# Patient Record
Sex: Female | Born: 1980 | Race: Black or African American | Hispanic: No | Marital: Married | Smoking: Never smoker
Health system: Southern US, Community
[De-identification: ages and names within clinical notes are randomized; demographics above are authoritative.]

## PROBLEM LIST (undated history)

## (undated) DIAGNOSIS — G43009 Migraine without aura, not intractable, without status migrainosus: Secondary | ICD-10-CM

## (undated) DIAGNOSIS — D649 Anemia, unspecified: Secondary | ICD-10-CM

## (undated) DIAGNOSIS — G5600 Carpal tunnel syndrome, unspecified upper limb: Secondary | ICD-10-CM

## (undated) HISTORY — DX: Anemia, unspecified: D64.9

## (undated) HISTORY — PX: TUBAL LIGATION: SHX77

## (undated) HISTORY — DX: Carpal tunnel syndrome, unspecified upper limb: G56.00

## (undated) HISTORY — DX: Migraine without aura, not intractable, without status migrainosus: G43.009

---

## 2003-11-03 ENCOUNTER — Other Ambulatory Visit: Admission: RE | Admit: 2003-11-03 | Discharge: 2003-11-03 | Payer: Self-pay | Admitting: *Deleted

## 2003-12-08 ENCOUNTER — Other Ambulatory Visit: Admission: RE | Admit: 2003-12-08 | Discharge: 2003-12-08 | Payer: Self-pay | Admitting: *Deleted

## 2004-10-07 ENCOUNTER — Other Ambulatory Visit: Admission: RE | Admit: 2004-10-07 | Discharge: 2004-10-07 | Payer: Self-pay | Admitting: *Deleted

## 2005-04-12 ENCOUNTER — Other Ambulatory Visit: Admission: RE | Admit: 2005-04-12 | Discharge: 2005-04-12 | Payer: Self-pay | Admitting: *Deleted

## 2005-12-27 ENCOUNTER — Encounter: Admission: RE | Admit: 2005-12-27 | Discharge: 2005-12-27 | Payer: Self-pay | Admitting: Obstetrics and Gynecology

## 2006-09-28 ENCOUNTER — Emergency Department (HOSPITAL_COMMUNITY): Admission: EM | Admit: 2006-09-28 | Discharge: 2006-09-28 | Payer: Self-pay | Admitting: Emergency Medicine

## 2007-04-09 ENCOUNTER — Emergency Department: Payer: Self-pay | Admitting: Emergency Medicine

## 2007-05-07 ENCOUNTER — Emergency Department (HOSPITAL_COMMUNITY): Admission: EM | Admit: 2007-05-07 | Discharge: 2007-05-07 | Payer: Self-pay | Admitting: Emergency Medicine

## 2007-07-27 ENCOUNTER — Ambulatory Visit (HOSPITAL_COMMUNITY): Admission: RE | Admit: 2007-07-27 | Discharge: 2007-07-27 | Payer: Self-pay | Admitting: Obstetrics and Gynecology

## 2007-08-26 ENCOUNTER — Inpatient Hospital Stay (HOSPITAL_COMMUNITY): Admission: AD | Admit: 2007-08-26 | Discharge: 2007-08-26 | Payer: Self-pay | Admitting: Gynecology

## 2007-08-26 ENCOUNTER — Ambulatory Visit: Payer: Self-pay | Admitting: Gynecology

## 2007-12-19 ENCOUNTER — Inpatient Hospital Stay (HOSPITAL_COMMUNITY): Admission: AD | Admit: 2007-12-19 | Discharge: 2007-12-23 | Payer: Self-pay | Admitting: Obstetrics and Gynecology

## 2007-12-20 ENCOUNTER — Encounter: Payer: Self-pay | Admitting: Obstetrics and Gynecology

## 2008-07-30 ENCOUNTER — Emergency Department (HOSPITAL_BASED_OUTPATIENT_CLINIC_OR_DEPARTMENT_OTHER): Admission: EM | Admit: 2008-07-30 | Discharge: 2008-07-30 | Payer: Self-pay | Admitting: Emergency Medicine

## 2008-07-30 ENCOUNTER — Ambulatory Visit: Payer: Self-pay | Admitting: Interventional Radiology

## 2009-09-30 ENCOUNTER — Emergency Department (HOSPITAL_COMMUNITY): Admission: EM | Admit: 2009-09-30 | Discharge: 2009-09-30 | Payer: Self-pay | Admitting: Emergency Medicine

## 2010-04-16 LAB — CBC
MCH: 30.8 pg (ref 26.0–34.0)
MCHC: 34.3 g/dL (ref 30.0–36.0)
MCV: 89.9 fL (ref 78.0–100.0)
Platelets: 170 10*3/uL (ref 150–400)
RBC: 4.22 MIL/uL (ref 3.87–5.11)
RDW: 12 % (ref 11.5–15.5)

## 2010-04-16 LAB — COMPREHENSIVE METABOLIC PANEL
ALT: 10 U/L (ref 0–35)
Alkaline Phosphatase: 44 U/L (ref 39–117)
BUN: 13 mg/dL (ref 6–23)
Chloride: 109 mEq/L (ref 96–112)
Creatinine, Ser: 0.8 mg/dL (ref 0.4–1.2)
GFR calc Af Amer: >60 mL/min (ref >60)
GFR calc non Af Amer: >60 mL/min (ref >60)
Sodium: 139 mEq/L (ref 135–145)

## 2010-04-16 LAB — DIFFERENTIAL
Basophils Relative: 1 % (ref 0–1)
Eosinophils Relative: 3 % (ref 0–5)
Lymphocytes Relative: 31 % (ref 12–46)
Lymphs Abs: 2.5 10*3/uL (ref 0.7–4.0)
Monocytes Absolute: 0.5 10*3/uL (ref 0.1–1.0)
Monocytes Relative: 7 % (ref 3–12)

## 2010-04-16 LAB — SALICYLATE LEVEL: Salicylate Lvl: <4 mg/dL (ref 2.8–20.0)

## 2010-04-16 LAB — URINALYSIS, ROUTINE W REFLEX MICROSCOPIC
Glucose, UA: NEGATIVE mg/dL
Leukocytes, UA: NEGATIVE
Urobilinogen, UA: 0.2 mg/dL (ref 0.0–1.0)

## 2010-04-16 LAB — URINE MICROSCOPIC-ADD ON

## 2010-04-16 LAB — RAPID URINE DRUG SCREEN, HOSP PERFORMED
Opiates: NOT DETECTED
Tetrahydrocannabinol: NOT DETECTED

## 2010-05-10 LAB — DIFFERENTIAL
Basophils Relative: 1 % (ref 0–1)
Eosinophils Absolute: 0.1 10*3/uL (ref 0.0–0.7)
Eosinophils Relative: 1 % (ref 0–5)
Lymphs Abs: 3.2 10*3/uL (ref 0.7–4.0)
Monocytes Relative: 7 % (ref 3–12)

## 2010-05-10 LAB — CBC
HCT: 38.4 % (ref 36.0–46.0)
MCV: 88.4 fL (ref 78.0–100.0)
RBC: 4.34 MIL/uL (ref 3.87–5.11)
RDW: 12.8 % (ref 11.5–15.5)
WBC: 8.3 10*3/uL (ref 4.0–10.5)

## 2010-05-10 LAB — PREGNANCY, URINE: Preg Test, Ur: NEGATIVE

## 2010-05-10 LAB — URINALYSIS, ROUTINE W REFLEX MICROSCOPIC
Ketones, ur: NEGATIVE mg/dL
Nitrite: NEGATIVE
Protein, ur: NEGATIVE mg/dL
Urobilinogen, UA: 0.2 mg/dL (ref 0.0–1.0)
pH: 6 (ref 5.0–8.0)

## 2010-05-10 LAB — BASIC METABOLIC PANEL
CO2: 30 mEq/L (ref 19–32)
Chloride: 105 mEq/L (ref 96–112)
Creatinine, Ser: 0.8 mg/dL (ref 0.4–1.2)
GFR calc non Af Amer: >60 mL/min (ref >60)

## 2010-06-15 NOTE — H&P (Signed)
NAMECANDIACE, Claudia Potts                ACCOUNT NO.:  [id number]   MEDICAL RECORD NO.:  [id number]          PATIENT TYPE:  INP   LOCATION:  9144                          FACILITY:  [facility]   PHYSICIAN:  Pieter Partridge, MD   DATE OF BIRTH:  May 01, 1980   DATE OF ADMISSION:  12/19/2007  DATE OF DISCHARGE:  12/23/2007                              HISTORY & PHYSICAL   CHIEF COMPLAINT:  Contractions.   HISTORY OF PRESENT ILLNESS:  [name] is a 30 year old gravida 2, para 1-  0-0-1, at 39-1/7th weeks with a history of HSV presented with complaint  of irregular contractions and vaginal bleeding, leakage of fluid.  She  has had herpes since age 16 and has periodic outbreaks with prodromal  symptoms.  The patient reports that she had some pain about 1 week ago  and it resolved 2 days ago.  She did not see any obvious lesions.  The  ulcers do not present at the same location and they continued to be in  various locations on the vulva.  The patient was on Valtrex 500 mg  b.i.d. which she states she was taking regularly.  Two days ago her  partner shaved her with a razor to prepare for labor.   REVIEW OF SYSTEMS:  Otherwise negative.   ALLERGIES:  No known drug allergies.   PAST MEDICAL HISTORY:  Negative.   SOCIAL HISTORY:  The patient is a Careers information officer.  Married.  No alcohol or  drug use.   OB HISTORY:  She had a vaginal delivery in 1999 at 38 weeks in  [place], 6 pounds 2 ounce female.  No complications with that  pregnancy.   GYN HISTORY:  She has had herpes and Chlamydia.   FAMILY HISTORY:  Remarkable for cancer, hypertension in her father and  heart disease in her father.  The patient's son has bronchitis and her  mother has asthma.   PHYSICAL EXAMINATION:  VITAL SIGNS:  On admission were normal.  GENERAL:  The patient was in moderate distress with contractions.  ABDOMEN:  Gravid, contractions palpated.  Moderate cervix was 5, effaced  to 100% -1.  She was ruptured with minimal fluid  return.  A vulvar  inspection revealed a linear open sore on the right side of her labia  which was slightly tender to touch.  The patient reviewed the lesion and  suspected that it was one of her typical lesions but it was open  nonetheless.  Her hemoglobin was 12.1.   ASSESSMENT:  Active labor at term with vulvar lesions suspicious for  active herpes.  I discussed at length if the increased mortality rate of  neonatal herpes infection is to be as high as 50%, the patient agreed on  this.  She did not want to take any chances so we proceeded with a  primary low-transverse cesarean section and she had previously been  counseled on bilateral tubal ligation which she continued to desire and  consented to prior to surgery.   ADDENDUM:  Prenatal care was unremarkable.  GBS status was negative and  all her  labs were within normal limits.      Pieter Partridge, MD  Electronically Signed     EBV/MEDQ  D:  12/23/2007  T:  12/23/2007  Job:  [id number]

## 2010-06-15 NOTE — Op Note (Signed)
NAMEABREANNA, DRAWDY                ACCOUNT NO.:  [id number]   MEDICAL RECORD NO.:  [id number]          PATIENT TYPE:  INP   LOCATION:  9144                          [facility]   PHYSICIAN:  Pieter Partridge, MD   DATE OF BIRTH:  11/15/1980   DATE OF PROCEDURE:  12/20/2007  DATE OF DISCHARGE:  12/23/2007                               OPERATIVE REPORT   ADDENDUM  Prior to closure, the bowel was run to make sure there was no injury.  The initial site was inspected and again the mucosa was intact.  The  intestines were returned to the abdomen without complication.      Pieter Partridge, MD  Electronically Signed     EBV/MEDQ  D:  12/23/2007  T:  12/24/2007  Job:  [id number] [id number]

## 2010-06-15 NOTE — Op Note (Signed)
Claudia Potts, Claudia Potts                ACCOUNT NO.:  [id number]   MEDICAL RECORD NO.:  [id number]          PATIENT TYPE:  INP   LOCATION:  9144                          [facility]   PHYSICIAN:  Pieter Partridge, MD   DATE OF BIRTH:  [birth year]   DATE OF PROCEDURE:  12/20/2007  DATE OF DISCHARGE:  12/23/2007                               OPERATIVE REPORT   PREOPERATIVE DIAGNOSES:  Pregnancy at 39-1/7th weeks, history of herpes  with questionable active lesion, undesired fertility.   POSTOPERATIVE DIAGNOSES:  Pregnancy at 39-1/7th weeks, history of herpes  with questionable active lesion, undesired fertility, right ovarian  endometrioma.   PROCEDURE:  Primary low transverse cesarean section, bilateral tubal  ligation via Pomeroy method, biopsy of right ovarian cyst.   SURGEON:  Pieter Partridge, MD   ASSISTANT:  Sarita Haver   ANESTHESIA:  Spinal.   ESTIMATED BLOOD LOSS:  800 mL.   INTRAVENOUS FLUIDS:  3200 mL.   URINE OUTPUT:  150 mL clear.   FINDINGS:  Vertex female infant 6 pounds 2 ounces, Apgars 9 at one and 9  at five, OP 1- to 2-cm right ovarian endometrioma, normal uterus,  tubes, and left ovary.   COMPLICATIONS:  Extension of transverse uterine incision to broad  ligament/paracervical area.   DISPOSITION:  Stable to PACU.   SPECIMENS:  Right and left fallopian tube segments, right ovarian cyst  biopsy.   PROCEDURE IN DETAIL:  Ms. [name] was taken to the operating room where she  underwent spinal anesthesia without complication.  She was placed in the  dorsal supine position and her cervix was evaluated.  She was 8 cm  completely effaced and +1 station.  She was then placed in the  dorsal  supine position with a leftward tilt and prepped and draped in the  normal sterile fashion.   After adequate anesthesia was confirmed, the abdomen was marked.  A  Pfannenstiel skin incision was made with the scalpel and carried down to  the underlying layer above the  fascia.  The subcutaneous vessels were  cauterized with hemostat and Bovie.   The fascia was then incised at the midline and extended laterally.  The  Kocher clamps were used to dissect the fascia off the rectus muscles  superiorly and inferiorly.  The muscles were then bluntly divided at the  midline and the Mayo scissors were used to extend that separation.  The  peritoneum was tented up with hemostat and entered sharply with the  Metzenbaum scissors stretched.  The  Alexis self-retaining retractor was  then inserted without complication.  The vesicouterine peritoneum was  then picked up with a Russian forceps and entered sharply with the  Metzenbaum scissors to create a bladder flap.  The lower uterine segment  was then incised with the scalpel in a transverse fashion until fluid  noted and the incision was extended laterally with a bandage scissors.   The occiput was quite low and slowly brought the proceeding part up to  the incision.  There were some difficulty delivering the head.  The baby  was  OP and at one point her face was in the incision.  I then swept her  head to the right side and flexed it anteriorly and brought the head up  and then she was delivered atraumatically.  Nose and mouth were  suctioned.  There was no nuchal cord noted.  The cord was clamped x2 and  cut, baby was placed at the bedside.   The placenta was then delivered.  There was quite amount of blood noted.  I attempted to grab the angles and started to close the hysterotomy  incision with a 0 fascia; however, the bleeding was not improving and  that is when I explored the right angle, which had an extension.  That  extension was then reapproximated with 0 chromic in a continuous locked  fashion and that helped with hemostasis.  The area had a little window  and the bowel was herniating through, so that was closed anteriorly and  posteriorly.  When closing anteriorly a small portion of the bowel  appeared  to get stuck in the little window via suture.  Initially upon  inspection, it appeared to have gone through the serosa.  However, it  looks like just the small serosal edge was coiled in the suture and that  was cut,  The bowel was examined thoroughly.  There was no puncture  wound.  I attempted to express any contents out and that was completely  closed.  The serosa did not even appear bruised.   The uterus was then tilted anteriorly and malleable retractors were used  to keep the bowel out of the way and the back of the extension was  closed with 0 chromic in 2 layers.  Due to the bowel getting into the  incision, a moist laparotomy pad was placed, which was eventually  removed.  I continued to close the hysterotomy incision in a continuous  locked fashion.  A second layer of imbrication was used.  The extension  was not imbricative.  The uterus was then returned to the abdomen.  All  laparotomy sponges were removed and a count was done at that time which  was correct.  Upon closure of the abdomen, the abdomen had previously  been copiously irrigated and all clots removed and there still seemed to  be a little bleeding in the right hand corner there and that was again  reapproximated for adequate hemostasis and Gelfoam was packed and it was  dry at the time of closure.   The rectus muscles were then reapproximated with 0 chromic in a U stitch  fashion.  The fascia was then reapproximated with 0 Vicryl in continuous  running fashion.  The subcutaneous base was then reapproximated with 2-0  plain gut in the continuous fashion and the skin was then reapproximated  with 4-0 Vicryl on a Keith needle and Dermabond was applied to the  incision and a pressure dressing was applied.  All instrument, sponge,  and needle counts were correct x3 and the patient was taken to the  recovery room in stable condition.      Pieter Partridge, MD  Electronically Signed     EBV/MEDQ  D:  12/23/2007   T:  12/24/2007  Job:  [id number]

## 2010-06-18 NOTE — Discharge Summary (Signed)
[name], [name]                ACCOUNT NO.:  [id number]   MEDICAL RECORD NO.:  [id number]          PATIENT TYPE:  INP   LOCATION:  9144                          [facility]   PHYSICIAN:  Pieter Partridge, MD   DATE OF BIRTH:  1980-08-27   DATE OF ADMISSION:  12/19/2007  DATE OF DISCHARGE:  12/23/2007                               DISCHARGE SUMMARY   ADMITTING DIAGNOSES:  1. Pregnancy at term, active labor, and vulvar lesions suspicious for      herpes.  2. Desired fertility as well.   PROCEDURE:  Primary low transverse caesarean section and also bilateral  tubal ligation.   BRIEF HISTORY OF PRESENT ILLNESS:  [name] is a 30 year old gravida 2,  para 1-0-0-1 with an estimated due date of December 26, 2007.  She has a  known history of herpes, has periodic outbreaks, and was on prophylaxis  with Valtrex, 4 weeks prior to delivery.  She was in an active phase of  labor.  Her membranes were ruptured and at that time, there was a  suspicious lesion that was linear in appearance.  Her partner has shaved  her a few weeks prior and the patient reporting having some prodromal  symptoms 1 week prior, but has since resolved.  The lesion was not  particularly painful, which the patient does get lesions and they are  normally painful.  However, this lesion was on the left labia.  Due to  the risk of mortality with neonatal herpes and it was recommended that  we perform a C-section.  The patient had undesired fertility and had  been counseled throughout the pregnancy on permanent sterilization and  desired to proceed.   She underwent a primary and low transverse caesarean section without  major complications.  She had an extension of her hysterotomy incision  to the right lower segment, which was repaired without any difficulty.   ESTIMATED BLOOD LOSS:  800 mL.   URINE OUTPUT:  Normal and there was no bladder injury.  The right ovary  appeared to have an endometrioma that had  ruptured and that was biopsied  and sent to path.  There was no residual bleeding from the biopsy.  She  went to PACU in stable condition.   Her postop course was routine.   DIET:  Her diet was advanced as tolerated.  She ambulated early and her  IV fluids were maintained until Foley and she was tolerating p.o. diet.  Her incision was within normal limits.   The only issue with her bowel motility, her abdomen was quite distended  and despite active bowel sounds, she did not have flatus until the  evening of postop day #2, which at one point she had a lot of flatus in  her distention.  Her distention went down.  She was afebrile.  Her vital  signs were within normal limits.   LABORATORY DATA:  Her postop hemoglobin and hematocrit was 8.8 and 25.7,  white count was 19.  Again, she was afebrile.   DISPOSITION:  The patient was disposition to home in good  condition.  She went home on Percocet 5/325 mg 1 to 2 tablets every 4-6 hours.  Motrin 800 mg p.o. q.8. and also iron and she was to resume Valtrex as  needed for outbreaks, twice daily for 3 days.  Diet was regular.  Activity is pelvic rest.  No heavy lifting.  Sexual activity delayed  until 6 weeks.  She is to follow up in 2 weeks at [facility]  for wound check.      Pieter Partridge, MD  Electronically Signed     EBV/MEDQ  D:  02/05/2008  T:  02/06/2008  Job:  [id number]

## 2010-07-26 ENCOUNTER — Ambulatory Visit (INDEPENDENT_AMBULATORY_CARE_PROVIDER_SITE_OTHER): Payer: PRIVATE HEALTH INSURANCE | Admitting: Marriage and Family Therapist

## 2010-07-26 DIAGNOSIS — F339 Major depressive disorder, recurrent, unspecified: Secondary | ICD-10-CM

## 2010-08-09 ENCOUNTER — Encounter (INDEPENDENT_AMBULATORY_CARE_PROVIDER_SITE_OTHER): Payer: PRIVATE HEALTH INSURANCE | Admitting: Marriage and Family Therapist

## 2010-08-09 DIAGNOSIS — F339 Major depressive disorder, recurrent, unspecified: Secondary | ICD-10-CM

## 2010-08-09 DIAGNOSIS — F410 Panic disorder [episodic paroxysmal anxiety] without agoraphobia: Secondary | ICD-10-CM

## 2010-08-16 ENCOUNTER — Ambulatory Visit (HOSPITAL_COMMUNITY)
Admission: RE | Admit: 2010-08-16 | Discharge: 2010-08-16 | Disposition: A | Discharge status: Home / Self Care | Payer: PRIVATE HEALTH INSURANCE | Source: Ambulatory Visit | Attending: Psychiatry | Admitting: Psychiatry

## 2010-08-16 ENCOUNTER — Encounter (HOSPITAL_BASED_OUTPATIENT_CLINIC_OR_DEPARTMENT_OTHER): Payer: PRIVATE HEALTH INSURANCE | Admitting: Marriage and Family Therapist

## 2010-08-16 DIAGNOSIS — F39 Unspecified mood [affective] disorder: Secondary | ICD-10-CM

## 2010-08-16 DIAGNOSIS — F332 Major depressive disorder, recurrent severe without psychotic features: Secondary | ICD-10-CM | POA: Insufficient documentation

## 2010-08-17 ENCOUNTER — Ambulatory Visit (INDEPENDENT_AMBULATORY_CARE_PROVIDER_SITE_OTHER): Payer: PRIVATE HEALTH INSURANCE | Admitting: Family Medicine

## 2010-08-17 ENCOUNTER — Encounter: Payer: Self-pay | Admitting: Family Medicine

## 2010-08-17 VITALS — BP 120/76 | HR 62 | Wt 166.0 lb

## 2010-08-17 DIAGNOSIS — F329 Major depressive disorder, single episode, unspecified: Secondary | ICD-10-CM

## 2010-08-17 DIAGNOSIS — R201 Hypoesthesia of skin: Secondary | ICD-10-CM

## 2010-08-17 DIAGNOSIS — R209 Unspecified disturbances of skin sensation: Secondary | ICD-10-CM
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
## 2010-08-17 NOTE — Patient Instructions (Signed)
Stay on the Zoloft and continue with Joanie. I'm here if you need me.

## 2010-08-18 ENCOUNTER — Other Ambulatory Visit (HOSPITAL_COMMUNITY): Payer: PRIVATE HEALTH INSURANCE | Attending: Psychiatry | Admitting: Psychiatry

## 2010-08-18 DIAGNOSIS — F332 Major depressive disorder, recurrent severe without psychotic features: Secondary | ICD-10-CM | POA: Insufficient documentation

## 2010-08-18 DIAGNOSIS — F411 Generalized anxiety disorder: Secondary | ICD-10-CM | POA: Insufficient documentation

## 2010-08-18 DIAGNOSIS — Z6379 Other stressful life events affecting family and household: Secondary | ICD-10-CM | POA: Insufficient documentation

## 2010-08-18 DIAGNOSIS — R209 Unspecified disturbances of skin sensation: Secondary | ICD-10-CM | POA: Insufficient documentation

## 2010-08-18 DIAGNOSIS — R51 Headache: Secondary | ICD-10-CM | POA: Insufficient documentation

## 2010-08-18 DIAGNOSIS — IMO0002 Reserved for concepts with insufficient information to code with codable children: Secondary | ICD-10-CM | POA: Insufficient documentation

## 2010-08-19 ENCOUNTER — Other Ambulatory Visit (HOSPITAL_COMMUNITY): Payer: PRIVATE HEALTH INSURANCE | Admitting: Psychiatry

## 2010-08-20 ENCOUNTER — Other Ambulatory Visit (HOSPITAL_COMMUNITY): Payer: PRIVATE HEALTH INSURANCE | Admitting: Psychiatry

## 2010-08-23 ENCOUNTER — Other Ambulatory Visit (HOSPITAL_COMMUNITY): Payer: PRIVATE HEALTH INSURANCE | Admitting: Psychiatry

## 2010-08-23 ENCOUNTER — Encounter (HOSPITAL_BASED_OUTPATIENT_CLINIC_OR_DEPARTMENT_OTHER): Payer: PRIVATE HEALTH INSURANCE | Admitting: Marriage and Family Therapist

## 2010-08-23 DIAGNOSIS — F39 Unspecified mood [affective] disorder: Secondary | ICD-10-CM

## 2010-08-24 ENCOUNTER — Other Ambulatory Visit (HOSPITAL_BASED_OUTPATIENT_CLINIC_OR_DEPARTMENT_OTHER): Payer: PRIVATE HEALTH INSURANCE | Admitting: Psychiatry

## 2010-08-24 DIAGNOSIS — F4310 Post-traumatic stress disorder, unspecified: Secondary | ICD-10-CM

## 2010-08-25 ENCOUNTER — Other Ambulatory Visit (HOSPITAL_COMMUNITY): Payer: PRIVATE HEALTH INSURANCE | Admitting: Psychiatry

## 2010-08-25 ENCOUNTER — Ambulatory Visit: Payer: PRIVATE HEALTH INSURANCE | Admitting: Family Medicine

## 2010-08-26 ENCOUNTER — Other Ambulatory Visit (HOSPITAL_COMMUNITY): Payer: PRIVATE HEALTH INSURANCE | Admitting: Psychiatry

## 2010-08-27 ENCOUNTER — Other Ambulatory Visit (HOSPITAL_COMMUNITY): Payer: PRIVATE HEALTH INSURANCE | Admitting: Psychiatry

## 2010-08-30 ENCOUNTER — Encounter (HOSPITAL_COMMUNITY): Payer: Self-pay | Admitting: Marriage and Family Therapist

## 2010-08-30 ENCOUNTER — Other Ambulatory Visit (HOSPITAL_COMMUNITY): Payer: PRIVATE HEALTH INSURANCE | Admitting: Psychiatry

## 2010-08-31 ENCOUNTER — Other Ambulatory Visit (HOSPITAL_COMMUNITY): Payer: PRIVATE HEALTH INSURANCE | Admitting: Psychiatry

## 2010-09-01 ENCOUNTER — Other Ambulatory Visit (HOSPITAL_COMMUNITY): Payer: PRIVATE HEALTH INSURANCE | Attending: Psychiatry | Admitting: Psychiatry

## 2010-09-01 DIAGNOSIS — F329 Major depressive disorder, single episode, unspecified: Secondary | ICD-10-CM | POA: Insufficient documentation

## 2010-09-01 DIAGNOSIS — F411 Generalized anxiety disorder: Secondary | ICD-10-CM | POA: Insufficient documentation

## 2010-09-01 DIAGNOSIS — F4310 Post-traumatic stress disorder, unspecified: Secondary | ICD-10-CM | POA: Insufficient documentation

## 2010-09-01 DIAGNOSIS — F3289 Other specified depressive episodes: Secondary | ICD-10-CM | POA: Insufficient documentation

## 2010-09-01 DIAGNOSIS — IMO0002 Reserved for concepts with insufficient information to code with codable children: Secondary | ICD-10-CM | POA: Insufficient documentation

## 2010-09-02 ENCOUNTER — Other Ambulatory Visit (HOSPITAL_COMMUNITY): Payer: PRIVATE HEALTH INSURANCE | Admitting: Psychiatry

## 2010-09-03 ENCOUNTER — Other Ambulatory Visit (HOSPITAL_COMMUNITY): Payer: PRIVATE HEALTH INSURANCE | Admitting: Psychiatry

## 2010-09-06 ENCOUNTER — Encounter (HOSPITAL_COMMUNITY): Payer: Self-pay | Admitting: Marriage and Family Therapist

## 2010-09-06 ENCOUNTER — Other Ambulatory Visit (HOSPITAL_COMMUNITY): Payer: PRIVATE HEALTH INSURANCE | Admitting: Psychiatry

## 2010-09-07 ENCOUNTER — Other Ambulatory Visit (HOSPITAL_COMMUNITY): Payer: PRIVATE HEALTH INSURANCE | Admitting: Psychiatry

## 2010-09-08 ENCOUNTER — Other Ambulatory Visit (HOSPITAL_COMMUNITY): Payer: PRIVATE HEALTH INSURANCE | Admitting: Psychiatry

## 2010-09-09 ENCOUNTER — Other Ambulatory Visit (HOSPITAL_COMMUNITY): Payer: PRIVATE HEALTH INSURANCE | Admitting: Psychiatry

## 2010-09-10 ENCOUNTER — Other Ambulatory Visit (HOSPITAL_COMMUNITY): Payer: PRIVATE HEALTH INSURANCE | Admitting: Psychiatry

## 2010-09-13 ENCOUNTER — Other Ambulatory Visit (HOSPITAL_COMMUNITY): Payer: PRIVATE HEALTH INSURANCE | Admitting: Psychiatry

## 2010-09-14 ENCOUNTER — Other Ambulatory Visit (HOSPITAL_COMMUNITY): Payer: PRIVATE HEALTH INSURANCE | Admitting: Psychiatry

## 2010-09-15 ENCOUNTER — Other Ambulatory Visit (HOSPITAL_COMMUNITY): Payer: PRIVATE HEALTH INSURANCE | Admitting: Psychiatry

## 2010-09-16 ENCOUNTER — Other Ambulatory Visit (HOSPITAL_COMMUNITY): Payer: PRIVATE HEALTH INSURANCE | Admitting: Psychiatry

## 2010-09-17 ENCOUNTER — Other Ambulatory Visit (HOSPITAL_COMMUNITY): Payer: PRIVATE HEALTH INSURANCE | Admitting: Psychiatry

## 2010-09-20 ENCOUNTER — Encounter (INDEPENDENT_AMBULATORY_CARE_PROVIDER_SITE_OTHER): Payer: PRIVATE HEALTH INSURANCE | Admitting: Marriage and Family Therapist

## 2010-09-20 ENCOUNTER — Other Ambulatory Visit (HOSPITAL_COMMUNITY): Payer: PRIVATE HEALTH INSURANCE | Admitting: Psychiatry

## 2010-09-20 DIAGNOSIS — F39 Unspecified mood [affective] disorder: Secondary | ICD-10-CM

## 2010-09-21 ENCOUNTER — Other Ambulatory Visit (HOSPITAL_COMMUNITY): Payer: PRIVATE HEALTH INSURANCE | Admitting: Psychiatry

## 2010-09-22 ENCOUNTER — Other Ambulatory Visit (HOSPITAL_COMMUNITY): Payer: PRIVATE HEALTH INSURANCE | Admitting: Psychiatry

## 2010-09-23 ENCOUNTER — Other Ambulatory Visit (HOSPITAL_COMMUNITY): Payer: PRIVATE HEALTH INSURANCE | Admitting: Psychiatry

## 2010-09-27 ENCOUNTER — Encounter (INDEPENDENT_AMBULATORY_CARE_PROVIDER_SITE_OTHER): Payer: PRIVATE HEALTH INSURANCE | Admitting: Marriage and Family Therapist

## 2010-09-27 DIAGNOSIS — F39 Unspecified mood [affective] disorder: Secondary | ICD-10-CM

## 2010-10-05 ENCOUNTER — Encounter (INDEPENDENT_AMBULATORY_CARE_PROVIDER_SITE_OTHER): Payer: PRIVATE HEALTH INSURANCE | Admitting: Marriage and Family Therapist

## 2010-10-05 DIAGNOSIS — F39 Unspecified mood [affective] disorder: Secondary | ICD-10-CM

## 2010-10-18 ENCOUNTER — Encounter (INDEPENDENT_AMBULATORY_CARE_PROVIDER_SITE_OTHER): Payer: PRIVATE HEALTH INSURANCE | Admitting: Marriage and Family Therapist

## 2010-10-18 DIAGNOSIS — F39 Unspecified mood [affective] disorder: Secondary | ICD-10-CM

## 2010-10-27 ENCOUNTER — Encounter (INDEPENDENT_AMBULATORY_CARE_PROVIDER_SITE_OTHER): Payer: PRIVATE HEALTH INSURANCE | Admitting: Marriage and Family Therapist

## 2010-10-27 DIAGNOSIS — F39 Unspecified mood [affective] disorder: Secondary | ICD-10-CM

## 2010-10-29 LAB — URINALYSIS, ROUTINE W REFLEX MICROSCOPIC
Specific Gravity, Urine: 1.015
Urobilinogen, UA: 0.2
pH: 7

## 2010-11-01 ENCOUNTER — Encounter (HOSPITAL_COMMUNITY): Payer: PRIVATE HEALTH INSURANCE | Admitting: Marriage and Family Therapist

## 2010-11-02 LAB — CBC
HCT: 37.3
MCV: 96.7
Platelets: 127 — ABNORMAL LOW
Platelets: 177
RDW: 14.3
WBC: 14 — ABNORMAL HIGH
WBC: 19.3 — ABNORMAL HIGH

## 2010-11-02 LAB — CREATININE, SERUM
Creatinine, Ser: 0.67
GFR calc Af Amer: >60

## 2010-11-02 LAB — RPR: RPR Ser Ql: NONREACTIVE

## 2010-11-12 LAB — STREP A DNA PROBE

## 2010-11-12 LAB — POCT RAPID STREP A: Streptococcus, Group A Screen (Direct): NEGATIVE

## 2011-05-13 ENCOUNTER — Other Ambulatory Visit (HOSPITAL_COMMUNITY)
Admission: RE | Admit: 2011-05-13 | Discharge: 2011-05-13 | Disposition: A | Discharge status: Home / Self Care | Payer: PRIVATE HEALTH INSURANCE | Source: Ambulatory Visit | Attending: Obstetrics & Gynecology | Admitting: Obstetrics & Gynecology

## 2011-05-13 ENCOUNTER — Other Ambulatory Visit: Payer: Self-pay | Admitting: Obstetrics & Gynecology

## 2011-05-13 DIAGNOSIS — Z1159 Encounter for screening for other viral diseases: Secondary | ICD-10-CM | POA: Insufficient documentation

## 2011-05-13 DIAGNOSIS — Z113 Encounter for screening for infections with a predominantly sexual mode of transmission: Secondary | ICD-10-CM | POA: Insufficient documentation

## 2011-05-13 DIAGNOSIS — Z124 Encounter for screening for malignant neoplasm of cervix: Secondary | ICD-10-CM | POA: Insufficient documentation

## 2012-04-19 DIAGNOSIS — N920 Excessive and frequent menstruation with regular cycle: Secondary | ICD-10-CM | POA: Insufficient documentation

## 2017-11-28 ENCOUNTER — Ambulatory Visit: Payer: Self-pay | Admitting: Gynecology

## 2017-12-12 ENCOUNTER — Encounter: Payer: Self-pay | Admitting: Gynecology

## 2017-12-12 ENCOUNTER — Ambulatory Visit: Payer: BC Managed Care – PPO | Admitting: Gynecology

## 2017-12-12 VITALS — BP 116/72 | Ht 67.0 in | Wt 173.0 lb

## 2017-12-12 DIAGNOSIS — Z01419 Encounter for gynecological examination (general) (routine) without abnormal findings: Secondary | ICD-10-CM | POA: Diagnosis not present

## 2017-12-12 NOTE — Patient Instructions (Signed)
Follow-up in 1 year for annual exam, sooner if any issues. 

## 2017-12-12 NOTE — Progress Notes (Signed)
    Claudia Potts [birth year] [id number]        37 y.o.  G2P2 new patient for annual gynecologic exam.  Has been seeing her primary provider for her routine gynecologic checkups but at this point has preferred to start seeing a gynecologist.  Pap smear/HPV 2018.  Having regular monthly menses.  Tubal sterilization.  Past medical history,surgical history, problem list, medications, allergies, family history and social history were all reviewed and documented as reviewed in the EPIC chart.  ROS:  Performed with pertinent positives and negatives included in the history, assessment and plan.   Additional significant findings : None   Exam: Kennon PortelaKim Potts assistant Vitals:   12/12/17 1534  BP: 116/72  Weight: 173 lb (78.5 kg)  Height: 5\' 7"  (1.702 m)   Body mass index is 27.1 kg/m.  General appearance:  Normal affect, orientation and appearance. Skin: Grossly normal HEENT: Without gross lesions.  No cervical or supraclavicular adenopathy. Thyroid normal.  Lungs:  Clear without wheezing, rales or rhonchi Cardiac: RR, without RMG Abdominal:  Soft, nontender, without masses, guarding, rebound, organomegaly or hernia Breasts:  Examined lying and sitting without masses, retractions, discharge or axillary adenopathy. Pelvic:  Ext, BUS, Vagina: Normal  Cervix: Normal  Uterus: Anteverted, normal size, shape and contour, midline and mobile nontender   Adnexa: Without masses or tenderness    Anus and perineum: Normal   Rectovaginal: Normal sphincter tone without palpated masses or tenderness.    Assessment/Plan:  37 y.o. G2P2 female for annual gynecologic exam with regular menses, tubal sterilization.   1. Pap smear/HPV 03/2016.  No Pap smear done today.  No history of significant abnormal Pap smears.  Continue with Pap smear/HPV at 5-year interval per current screening guidelines. 2. Breast health.  Breast exam normal today.  SBE monthly reviewed.  Plan baseline mammogram at age 37.  No  family history of breast cancer. 3. Health maintenance.  No routine lab work done as patient does this elsewhere.  Follow-up 1 year, sooner as needed.   [name], 3:53 PM 12/12/2017

## 2018-03-14 DIAGNOSIS — F3281 Premenstrual dysphoric disorder: Secondary | ICD-10-CM | POA: Insufficient documentation

## 2018-10-23 ENCOUNTER — Encounter: Payer: Self-pay | Admitting: Gynecology

## 2019-03-29 ENCOUNTER — Other Ambulatory Visit: Payer: Self-pay

## 2019-04-01 ENCOUNTER — Ambulatory Visit: Payer: BC Managed Care – PPO | Admitting: Obstetrics and Gynecology

## 2019-04-01 ENCOUNTER — Other Ambulatory Visit: Payer: Self-pay

## 2019-04-01 ENCOUNTER — Encounter: Payer: Self-pay | Admitting: Obstetrics and Gynecology

## 2019-04-01 VITALS — BP 116/74 | Ht 67.0 in | Wt 178.0 lb

## 2019-04-01 DIAGNOSIS — Z01419 Encounter for gynecological examination (general) (routine) without abnormal findings: Secondary | ICD-10-CM

## 2019-04-01 DIAGNOSIS — G43829 Menstrual migraine, not intractable, without status migrainosus: Secondary | ICD-10-CM | POA: Insufficient documentation

## 2019-04-01 NOTE — Patient Instructions (Addendum)
Try taking 400-600 mg ibuprofen starting the day before your period/headaches should begin, and take 4 times per day with food and plenty of water (8 oz or so), continue for 3-5 days, this may help your menstrual headaches. If not, hormonal birth control methods may be helpful for this problem too. Recurrent Migraine Headache  Migraines are a type of headache, and they are usually stronger and more sudden than normal headaches (tension headaches). Migraines are characterized by an intense pulsing, throbbing pain that is usually only present on one side of the head. Sometimes, migraine headaches can cause nausea, vomiting, sensitivity to light and sound, and vision changes. Recurrent migraines keep coming back (recurring). A migraine can last from 4 hours up to 3 days. What are the causes? The exact cause of this condition is not known. However, a migraine may be caused when nerves in the brain become irritated and release chemicals that cause inflammation of blood vessels. This inflammation causes pain. Certain things may also trigger migraines, such as:  A disruption in your regular eating and sleeping schedule.  Smoking.  Stress.  Menstruation.  Certain foods and drinks, such as: ? Aged cheese. ? Chocolate. ? Alcohol. ? Caffeine. ? Foods or drinks that contain nitrates, glutamate, aspartame, MSG, or tyramine.  Lack of sleep.  Hunger.  Physical exertion.  Fatigue.  High altitude.  Weather changes.  Medicines, such as: ? Nitroglycerin, which is used to treat chest pain. ? Birth control pills. ? Estrogen. ? Some blood pressure medicines. What are the signs or symptoms? Symptoms of this condition vary for each person and may include:  Pain that is usually only present on one side of the head. In some cases, the pain may be on both sides of the head or around the head or neck.  Pulsating or throbbing pain.  Severe pain that prevents daily activities.  Pain that is  aggravated by any physical activity.  Nausea, vomiting, or both.  Dizziness.  Pain with exposure to bright lights, loud noises, or activity.  General sensitivity to bright lights, loud noises, or smells. Before you get a migraine, you may get warning signs that a migraine is coming (aura). An aura may include:  Seeing flashing lights.  Seeing bright spots, halos, or zigzag lines.  Having tunnel vision or blurred vision.  Having numbness or a tingling feeling.  Having trouble talking.  Having muscle weakness.  Smelling a certain odor. How is this diagnosed? This condition is often diagnosed based on:  Your symptoms and medical history.  A physical exam. You may also have tests, including:  A CT scan or MRI of your brain. These imaging tests cannot diagnose migraines, but they can help to rule out other causes of headaches.  Blood tests. How is this treated? This condition is treated with:  Medicines. These are used for: ? Lessening pain and nausea. ? Preventing recurrent migraines.  Lifestyle changes, such as changes to your diet or sleeping patterns.  Behavior therapy, such as relaxation training or biofeedback. Biofeedback is a treatment that involves teaching you to relax and use your brain to lower your heart rate and control your breathing. Follow these instructions at home: Medicines  Take over-the-counter and prescription medicines only as told by your health care provider.  Do not drive or use heavy machinery while taking prescription pain medicine. Lifestyle  Do not use any products that contain nicotine or tobacco, such as cigarettes and e-cigarettes. If you need help quitting, ask your health care  provider.  Limit alcohol intake to no more than 1 drink a day for nonpregnant women and 2 drinks a day for men. One drink equals 12 oz of beer, 5 oz of wine, or 1 oz of hard liquor.  Get 7-9 hours of sleep each night, or the amount of sleep recommended by  your health care provider.  Limit your stress. Talk with your health care provider if you need help with stress management.  Maintain a healthy weight. If you need help losing weight, ask your health care provider.  Exercise regularly. Aim for 150 minutes of moderate-intensity exercise (walking, biking, yoga) or 75 minutes of vigorous exercise (running, circuit training, swimming) each week. General instructions   Keep a journal to find out what triggers your migraine headaches so you can avoid these triggers. For example, write down: ? What you eat and drink. ? How much sleep you get. ? Any change to your diet or medicines.  Lie down in a dark, quiet room when you have a migraine.  Try placing a cool towel over your head when you have a migraine.  Keep lights dim, if bright lights bother you and make your migraines worse.  Keep all follow-up visits as told by your health care provider. This is important. Contact a health care provider if:  Your pain does not improve, even with medicine.  Your migraines continue to return, even with medicine.  You have a fever.  You have weight loss. Get help right away if:  Your migraine becomes severe and medicine does not help.  You have a stiff neck.  You have a loss of vision.  You have muscle weakness or loss of muscle control.  You start losing your balance or have trouble walking.  You feel faint or you pass out.  You develop new, severe symptoms.  You start having abrupt severe headaches that last for a second or less, like a thunderclap. Summary  Migraine headaches are usually stronger and more sudden than normal headaches (tension headaches). Migraines are characterized by an intense pulsing, throbbing pain that is usually only present on one side of the head.  The exact cause of this condition is not known. However, a migraine may be caused when nerves in the brain become irritated and release chemicals that cause  inflammation of blood vessels.  Certain things may trigger migraines, such as changes to diet or sleeping patterns, smoking, certain foods, alcohol, stress, and certain medicines.  Sometimes, migraine headaches can cause nausea, vomiting, sensitivity to light and sound, and vision changes.  Migraines are often diagnosed based on your symptoms, medical history, and a physical exam. This information is not intended to replace advice given to you by your health care provider. Make sure you discuss any questions you have with your health care provider. Document Revised: 01/20/2017 Document Reviewed: 10/30/2015 Elsevier Patient Education  2020 [employer].

## 2019-04-01 NOTE — Progress Notes (Signed)
   [facility] 03/01/1980 [id number]  SUBJECTIVE:  39 y.o. G2P2 female for annual routine gynecologic exam. She has no gynecologic concerns.  Her periods actually have been getting lighter, only about 5 days of flow now versus 7 days previously.  Cramps are less intense than before.  However, she is getting headaches at beginning and end of menstrual period, it really starts the first day or so of the period and then is better and returns as her flow is diminishing.    No current outpatient medications on file.   No current facility-administered medications for this visit.   Allergies: Patient has no known allergies.  Patient's last menstrual period was 03/24/2019.  Past medical history,surgical history, problem list, medications, allergies, family history and social history were all reviewed and documented as reviewed in the EPIC chart.  ROS:  Feeling well. No dyspnea or chest pain on exertion.  No abdominal pain, change in bowel habits, black or bloody stools.  No urinary tract symptoms. GYN ROS: normal menses, no abnormal bleeding, pelvic pain or discharge, no breast pain or new or enlarging lumps on self exam. No neurological complaints.    OBJECTIVE:  BP 116/74   Ht 5\' 7"  (1.702 m)   Wt 178 lb (80.7 kg)   LMP 03/24/2019   BMI 27.88 kg/m  The patient appears well, alert, oriented x 3, in no distress. ENT normal.  Neck supple. No cervical or supraclavicular adenopathy or thyromegaly.  Lungs are clear, good air entry, no wheezes, rhonchi or rales. S1 and S2 normal, no murmurs, regular rate and rhythm.  Abdomen soft without tenderness, guarding, mass or organomegaly.  Neurological is normal, no focal findings.  BREAST EXAM: breasts appear normal, no suspicious masses, no skin or nipple changes or axillary nodes  PELVIC EXAM: VULVA: normal appearing vulva with no masses, tenderness or lesions, VAGINA: normal appearing vagina with normal color and discharge, no lesions,  CERVIX: normal appearing cervix without discharge or lesions, UTERUS: uterus is normal size, shape, consistency and nontender, ADNEXA: normal adnexa in size, nontender and no masses  Chaperone: 03/26/2019 present during the examination  ASSESSMENT:  39 y.o. G2P2 here for annual gynecologic exam  PLAN:   1.  Menstrual headaches.  Recommend trialing premedication with scheduled ibuprofen for 3 to 5 days on her period.  If this is not helping, then she may want to consider either triptans or hormonal contraception (if no aura symptoms) to help regulate hormonal changes throughout the cycle.  No other menstrual or hormonal concerns.   2. Pap smear/HPV 2018. Current screening guidelines calling for the 5-year Pap smear cytology with HPV testing interval, pap due in 2023. 3. Contraception. Prior tubal sterilization. 4. Normal breast exam.  Encouraged breast self awareness and notify 2024 of any concerning changes.  Baseline mammogram recommended at age 39. No family history of breast cancer. 5. Health maintenance.  Routine screening blood work performed with her primary care doctor.  Return annually or sooner, prn.  41 MD  04/01/19

## 2019-09-03 ENCOUNTER — Other Ambulatory Visit: Payer: Self-pay

## 2019-09-03 ENCOUNTER — Encounter (HOSPITAL_BASED_OUTPATIENT_CLINIC_OR_DEPARTMENT_OTHER): Payer: Self-pay | Admitting: *Deleted

## 2019-09-03 ENCOUNTER — Emergency Department (HOSPITAL_BASED_OUTPATIENT_CLINIC_OR_DEPARTMENT_OTHER): Payer: BC Managed Care – PPO

## 2019-09-03 ENCOUNTER — Emergency Department (HOSPITAL_BASED_OUTPATIENT_CLINIC_OR_DEPARTMENT_OTHER)
Admission: EM | Admit: 2019-09-03 | Discharge: 2019-09-03 | Disposition: A | Discharge status: Home / Self Care | Payer: BC Managed Care – PPO | Attending: Emergency Medicine | Admitting: Emergency Medicine

## 2019-09-03 DIAGNOSIS — N3090 Cystitis, unspecified without hematuria: Secondary | ICD-10-CM | POA: Diagnosis not present

## 2019-09-03 DIAGNOSIS — Z20822 Contact with and (suspected) exposure to covid-19: Secondary | ICD-10-CM | POA: Diagnosis not present

## 2019-09-03 DIAGNOSIS — R509 Fever, unspecified: Secondary | ICD-10-CM

## 2019-09-03 DIAGNOSIS — N3000 Acute cystitis without hematuria: Secondary | ICD-10-CM

## 2019-09-03 DIAGNOSIS — M7918 Myalgia, other site: Secondary | ICD-10-CM | POA: Diagnosis not present

## 2019-09-03 DIAGNOSIS — R519 Headache, unspecified: Secondary | ICD-10-CM | POA: Insufficient documentation

## 2019-09-03 LAB — HEPATIC FUNCTION PANEL
ALT: 9 U/L (ref 0–44)
AST: 17 U/L (ref 15–41)
Albumin: 4.6 g/dL (ref 3.5–5.0)
Alkaline Phosphatase: 51 U/L (ref 38–126)
Bilirubin, Direct: 0.1 mg/dL (ref 0.0–0.2)
Indirect Bilirubin: 0.4 mg/dL (ref 0.3–0.9)
Total Bilirubin: 0.5 mg/dL (ref 0.3–1.2)
Total Protein: 8.8 g/dL — ABNORMAL HIGH (ref 6.5–8.1)

## 2019-09-03 LAB — CBC WITH DIFFERENTIAL/PLATELET
Abs Immature Granulocytes: 0.07 10*3/uL (ref 0.00–0.07)
Basophils Absolute: 0 10*3/uL (ref 0.0–0.1)
Basophils Relative: 0 %
Eosinophils Absolute: 0 10*3/uL (ref 0.0–0.5)
Eosinophils Relative: 0 %
HCT: 32 % — ABNORMAL LOW (ref 36.0–46.0)
Hemoglobin: 9.4 g/dL — ABNORMAL LOW (ref 12.0–15.0)
Immature Granulocytes: 1 %
Lymphocytes Relative: 7 %
Lymphs Abs: 1 10*3/uL (ref 0.7–4.0)
MCH: 23.4 pg — ABNORMAL LOW (ref 26.0–34.0)
MCHC: 29.4 g/dL — ABNORMAL LOW (ref 30.0–36.0)
MCV: 79.8 fL — ABNORMAL LOW (ref 80.0–100.0)
Monocytes Absolute: 0.4 10*3/uL (ref 0.1–1.0)
Monocytes Relative: 3 %
Neutro Abs: 12.6 10*3/uL — ABNORMAL HIGH (ref 1.7–7.7)
Neutrophils Relative %: 89 %
Platelets: 282 10*3/uL (ref 150–400)
RBC: 4.01 MIL/uL (ref 3.87–5.11)
RDW: 17.4 % — ABNORMAL HIGH (ref 11.5–15.5)
WBC: 14 10*3/uL — ABNORMAL HIGH (ref 4.0–10.5)
nRBC: 0 % (ref 0.0–0.2)

## 2019-09-03 LAB — BASIC METABOLIC PANEL
Anion gap: 13 (ref 5–15)
BUN: 10 mg/dL (ref 6–20)
CO2: 22 mmol/L (ref 22–32)
Calcium: 8.8 mg/dL — ABNORMAL LOW (ref 8.9–10.3)
Chloride: 99 mmol/L (ref 98–111)
Creatinine, Ser: 0.77 mg/dL (ref 0.44–1.00)
GFR calc Af Amer: >60 mL/min (ref >60)
GFR calc non Af Amer: >60 mL/min (ref >60)
Glucose, Bld: 106 mg/dL — ABNORMAL HIGH (ref 70–99)
Potassium: 3.6 mmol/L (ref 3.5–5.1)
Sodium: 134 mmol/L — ABNORMAL LOW (ref 135–145)

## 2019-09-03 LAB — URINALYSIS, ROUTINE W REFLEX MICROSCOPIC
Bilirubin Urine: NEGATIVE
Glucose, UA: NEGATIVE mg/dL
Ketones, ur: 15 mg/dL — AB
Nitrite: NEGATIVE
Protein, ur: NEGATIVE mg/dL
Specific Gravity, Urine: 1.02 (ref 1.005–1.030)
pH: 5.5 (ref 5.0–8.0)

## 2019-09-03 LAB — URINALYSIS, MICROSCOPIC (REFLEX)

## 2019-09-03 LAB — SARS CORONAVIRUS 2 BY RT PCR (HOSPITAL ORDER, PERFORMED IN ~~LOC~~ HOSPITAL LAB): SARS Coronavirus 2: NEGATIVE

## 2019-09-03 LAB — PREGNANCY, URINE: Preg Test, Ur: NEGATIVE

## 2019-09-03 LAB — LIPASE, BLOOD: Lipase: 22 U/L (ref 11–51)

## 2019-09-03 MED ORDER — IOHEXOL 300 MG/ML  SOLN
100.0000 mL | Freq: Once | INTRAMUSCULAR | Status: AC | PRN
  Administered 2019-09-03: 100 mL via INTRAVENOUS

## 2019-09-03 MED ORDER — FENTANYL CITRATE (PF) 100 MCG/2ML IJ SOLN
50.0000 ug | Freq: Once | INTRAMUSCULAR | Status: AC
  Administered 2019-09-03: 50 ug via INTRAVENOUS
  Filled 2019-09-03: qty 2

## 2019-09-03 MED ORDER — SODIUM CHLORIDE 0.9 % IV BOLUS
1000.0000 mL | Freq: Once | INTRAVENOUS | Status: AC
Start: 2019-09-03 — End: 2019-09-03
  Administered 2019-09-03: 1000 mL via INTRAVENOUS

## 2019-09-03 MED ORDER — CEPHALEXIN 500 MG PO CAPS: 500.0000 mg | ORAL_CAPSULE | Freq: Two times a day (BID) | ORAL | 0 refills | Status: AC

## 2019-09-03 MED ORDER — ACETAMINOPHEN 325 MG PO TABS
650.0000 mg | ORAL_TABLET | Freq: Once | ORAL | Status: AC
  Administered 2019-09-03: 650 mg via ORAL
  Filled 2019-09-03: qty 2

## 2019-09-03 MED ORDER — KETOROLAC TROMETHAMINE 15 MG/ML IJ SOLN
15.0000 mg | Freq: Once | INTRAMUSCULAR | Status: AC
  Administered 2019-09-03: 15 mg via INTRAVENOUS
  Filled 2019-09-03: qty 1

## 2019-09-03 MED ORDER — CEPHALEXIN 250 MG PO CAPS
500.0000 mg | ORAL_CAPSULE | Freq: Once | ORAL | Status: AC
  Administered 2019-09-03: 500 mg via ORAL
  Filled 2019-09-03: qty 2

## 2019-09-03 NOTE — ED Triage Notes (Signed)
Pt c/o fever chills , bodyaches , h/a x 3 days

## 2019-09-03 NOTE — ED Provider Notes (Signed)
[facility] EMERGENCY DEPARTMENT Provider Note   CSN: [id number] Arrival date & time: 09/03/19  1617     History Chief Complaint  Patient presents with  . Fever    Claudia Potts is a 39 y.o. female.  The history is provided by the patient.  Fever Temp source:  Subjective Severity:  Mild Onset quality:  Gradual Timing:  Constant Progression:  Unchanged Chronicity:  New Relieved by:  Nothing Worsened by:  Nothing Associated symptoms: chills, dysuria (frequency and abdominal pain), headaches and myalgias   Associated symptoms: no chest pain, no cough, no ear pain, no rash, no sore throat and no vomiting   Risk factors: no sick contacts        History reviewed. No pertinent past medical history.  Patient Active Problem List   Diagnosis Date Noted  . 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 CESAREAN SECTION    . TUBAL LIGATION       OB History    Gravida  2   Para  2   Term      Preterm      AB      Living  2     SAB      TAB      Ectopic      Multiple      Live Births              Family History  Problem Relation Age of Onset  . Diabetes Father   . Cancer Paternal Grandfather        Lung    Social History   Tobacco Use  . Smoking status: Never Smoker  . Smokeless tobacco: Never Used  Vaping Use  . Vaping Use: Never used  Substance Use Topics  . Alcohol use: Yes    Alcohol/week: 7.0 standard drinks    Types: 7 Standard drinks or equivalent per week  . Drug use: Never    Home Medications Prior to Admission medications   Medication Sig Start Date End Date Taking? Authorizing Provider  cephALEXin (KEFLEX) 500 MG capsule Take 1 capsule (500 mg total) by mouth 2 (two) times daily for 5 days. 09/03/19 09/08/19  Virgina Norfolk, DO  Multiple Vitamin (MULTIVITAMIN) capsule Take 1 capsule by mouth daily.    [provider]    Allergies    Patient has no known  allergies.  Review of Systems   Review of Systems  Constitutional: Positive for chills and fever.  HENT: Negative for ear pain and sore throat.   Eyes: Negative for pain and visual disturbance.  Respiratory: Negative for cough and shortness of breath.   Cardiovascular: Negative for chest pain and palpitations.  Gastrointestinal: Negative for abdominal pain and vomiting.  Genitourinary: Positive for dysuria (frequency and abdominal pain). Negative for hematuria.  Musculoskeletal: Positive for myalgias. Negative for arthralgias and back pain.  Skin: Negative for color change and rash.  Neurological: Positive for headaches. Negative for seizures and syncope.  All other systems reviewed and are negative.   Physical Exam Updated Vital Signs  ED Triage Vitals  Enc Vitals Group     BP 09/03/19 2003 122/77     Pulse Rate 09/03/19 2003 86     Resp 09/03/19 2003 18     Temp 09/03/19 1638 (!) 100.8 F (38.2 C)     Temp Source 09/03/19 1638 Oral     SpO2 09/03/19 2003 100 %  Weight 09/03/19 1634 178 lb (80.7 kg)     Height 09/03/19 1634 5\' 7"  (1.702 m)     Head Circumference --      Peak Flow --      Pain Score 09/03/19 1634 8     Pain Loc --      Pain Edu? --      Excl. in GC? --     Physical Exam Vitals and nursing note reviewed.  Constitutional:      General: She is not in acute distress.    Appearance: She is well-developed. She is not ill-appearing.  HENT:     Head: Normocephalic and atraumatic.     Nose: Nose normal.     Mouth/Throat:     Mouth: Mucous membranes are moist.  Eyes:     Extraocular Movements: Extraocular movements intact.     Conjunctiva/sclera: Conjunctivae normal.     Pupils: Pupils are equal, round, and reactive to light.  Cardiovascular:     Rate and Rhythm: Normal rate and regular rhythm.     Pulses: Normal pulses.     Heart sounds: Normal heart sounds. No murmur heard.   Pulmonary:     Effort: Pulmonary effort is normal. No respiratory  distress.     Breath sounds: Normal breath sounds.  Abdominal:     Palpations: Abdomen is soft.     Tenderness: There is abdominal tenderness (suprapubic).  Musculoskeletal:        General: No tenderness. Normal range of motion.     Cervical back: Normal range of motion and neck supple.  Skin:    General: Skin is warm and dry.  Neurological:     Mental Status: She is alert and oriented to person, place, and time.     Cranial Nerves: No cranial nerve deficit.     Sensory: No sensory deficit.     Motor: No weakness.     Coordination: Coordination normal.  Psychiatric:        Mood and Affect: Mood normal.     ED Results / Procedures / Treatments   Labs (all labs ordered are listed, but only abnormal results are displayed) Labs Reviewed  CBC WITH DIFFERENTIAL/PLATELET - Abnormal; Notable for the following components:      Result Value   WBC 14.0 (*)    Hemoglobin 9.4 (*)    HCT 32.0 (*)    MCV 79.8 (*)    MCH 23.4 (*)    MCHC 29.4 (*)    RDW 17.4 (*)    Neutro Abs 12.6 (*)    All other components within normal limits  BASIC METABOLIC PANEL - Abnormal; Notable for the following components:   Sodium 134 (*)    Glucose, Bld 106 (*)    Calcium 8.8 (*)    All other components within normal limits  HEPATIC FUNCTION PANEL - Abnormal; Notable for the following components:   Total Protein 8.8 (*)    All other components within normal limits  URINALYSIS, ROUTINE W REFLEX MICROSCOPIC - Abnormal; Notable for the following components:   Hgb urine dipstick SMALL (*)    Ketones, ur 15 (*)    Leukocytes,Ua SMALL (*)    All other components within normal limits  URINALYSIS, MICROSCOPIC (REFLEX) - Abnormal; Notable for the following components:   Bacteria, UA FEW (*)    All other components within normal limits  SARS CORONAVIRUS 2 BY RT PCR (HOSPITAL ORDER, PERFORMED IN [facility])  URINE CULTURE  LIPASE,  BLOOD  PREGNANCY, URINE    EKG None  Radiology CT ABDOMEN  PELVIS W CONTRAST  Result Date: 09/03/2019 CLINICAL DATA:  Abdomen pain with fever EXAM: CT ABDOMEN AND PELVIS WITH CONTRAST TECHNIQUE: Multidetector CT imaging of the abdomen and pelvis was performed using the standard protocol following bolus administration of intravenous contrast. CONTRAST:  OMNIPAQUE IOHEXOL 300 MG/ML  SOLN COMPARISON:  CT 07/30/2008 FINDINGS: Lower chest: No acute abnormality. Hepatobiliary: No focal liver abnormality is seen. No gallstones, gallbladder wall thickening, or biliary dilatation. Pancreas: Unremarkable. No pancreatic ductal dilatation or surrounding inflammatory changes. Spleen: Normal in size without focal abnormality. Adrenals/Urinary Tract: Adrenal glands are unremarkable. Kidneys are normal, without renal calculi, focal lesion, or hydronephrosis. Bladder is unremarkable. Stomach/Bowel: The stomach is nonenlarged. Mild air and fluid-filled borderline distended small bowel in the upper abdomen but without convincing evidence for obstruction at this time. No bowel wall thickening. Negative appendix. Vascular/Lymphatic: No significant vascular findings are present. No enlarged abdominal or pelvic lymph nodes. Reproductive: Uterus and bilateral adnexa are unremarkable. Other: Small free fluid in the pelvis.  No free air Musculoskeletal: No acute or significant osseous findings. IMPRESSION: 1. No CT evidence for acute intra-abdominal or pelvic abnormality. 2. Small free fluid in the pelvis. Electronically Signed   By: Jasmine Pang M.D.   On: 09/03/2019 22:56   DG Chest Portable 1 View  Result Date: 09/03/2019 CLINICAL DATA:  Fever EXAM: PORTABLE CHEST 1 VIEW COMPARISON:  None. FINDINGS: No consolidation, features of edema, pneumothorax, or effusion. Pulmonary vascularity is normally distributed. The cardiomediastinal contours are unremarkable. No acute osseous or soft tissue abnormality. IMPRESSION: No acute cardiopulmonary abnormality. Electronically Signed   By: Kreg Shropshire M.D.   On: 09/03/2019 21:22    Procedures Procedures (including critical care time)  Medications Ordered in ED Medications  ketorolac (TORADOL) 15 MG/ML injection 15 mg (has no administration in time range)  cephALEXin (KEFLEX) capsule 500 mg (has no administration in time range)  acetaminophen (TYLENOL) tablet 650 mg (650 mg Oral Given 09/03/19 1641)  fentaNYL (SUBLIMAZE) injection 50 mcg (50 mcg Intravenous Given 09/03/19 2130)  sodium chloride 0.9 % bolus 1,000 mL (1,000 mLs Intravenous New Bag/Given 09/03/19 2130)  acetaminophen (TYLENOL) tablet 650 mg (650 mg Oral Given 09/03/19 2216)  iohexol (OMNIPAQUE) 300 MG/ML solution 100 mL (100 mLs Intravenous Contrast Given 09/03/19 2241)    ED Course  I have reviewed the triage vital signs and the nursing notes.  Pertinent labs & imaging results that were available during my care of the patient were reviewed by me and considered in my medical decision making (see chart for details).    MDM Rules/Calculators/A&P                          Claudia Potts is 39 year old female with no significant medical history who presents to the ED with fever, body aches for the last 3 days.  Fever upon arrival but otherwise normal vitals.  Has mostly viral type symptoms but does have dysuria and suprapubic abdominal pain.  Covid test is negative.  No major respiratory problems.  Has some suprapubic tenderness on exam but no real focal right lower quadrant abdominal pain.  Will get lab work including urinalysis.  Lower suspicion for appendicitis or intra-abdominal infection at this time.  Suspect likely viral process or UTI.  Will consider CT scan on reevaluation and lab work.  Mild leukocytosis of 14.  Otherwise no significant anemia,  electrolyte abnormality, kidney injury.  Analysis with leukocytes and some bacteria.  Suspect possible UTI and will treat with Keflex.  However given some lower abdominal discomfort we will get a CT scan to further evaluate  for infectious causes.  CT scan abdomen and pelvis normal.  No acute findings.  No appendicitis.  No colitis.  Will treat with Keflex for UTI.  Felt better after IV fluids and Tylenol and Toradol.  Given return precautions and discharged in ED in good condition.  This chart was dictated using voice recognition software.  Despite best efforts to proofread,  errors can occur which can change the documentation meaning.    Final Clinical Impression(s) / ED Diagnoses Final diagnoses:  Fever, unspecified fever cause  Acute cystitis without hematuria    Rx / DC Orders ED Discharge Orders         Ordered    cephALEXin (KEFLEX) 500 MG capsule  2 times daily     Discontinue  Reprint     09/03/19 2307           Virgina Norfolk, DO 09/03/19 2308

## 2019-09-04 LAB — URINE CULTURE

## 2020-06-18 ENCOUNTER — Encounter: Payer: Self-pay | Admitting: Nurse Practitioner

## 2020-06-22 ENCOUNTER — Other Ambulatory Visit: Payer: Self-pay

## 2020-06-22 ENCOUNTER — Encounter: Payer: Self-pay | Admitting: Nurse Practitioner

## 2020-06-22 ENCOUNTER — Other Ambulatory Visit (HOSPITAL_COMMUNITY)
Admission: RE | Admit: 2020-06-22 | Discharge: 2020-06-22 | Disposition: A | Discharge status: Home / Self Care | Payer: BC Managed Care – PPO | Source: Ambulatory Visit | Attending: Nurse Practitioner | Admitting: Nurse Practitioner

## 2020-06-22 ENCOUNTER — Ambulatory Visit (INDEPENDENT_AMBULATORY_CARE_PROVIDER_SITE_OTHER): Payer: BC Managed Care – PPO | Admitting: Nurse Practitioner

## 2020-06-22 VITALS — BP 118/74 | Ht 67.0 in | Wt 171.0 lb

## 2020-06-22 DIAGNOSIS — Z01419 Encounter for gynecological examination (general) (routine) without abnormal findings: Secondary | ICD-10-CM | POA: Insufficient documentation

## 2020-06-22 DIAGNOSIS — N920 Excessive and frequent menstruation with regular cycle: Secondary | ICD-10-CM | POA: Diagnosis not present

## 2020-06-22 NOTE — Progress Notes (Signed)
   Claudia Potts [facility] 07/11/1980 [id number]   History:  40 y.o. G2P2 presents for annual exam. Having cycles every 3 weeks for about 1 year. Prior to this cycles were every 28 days. First 2 days are heavy with clots, then 3 days of light bleeding, then 1-2 days of spotting with total bleeding time of 7 days. Mother and sister with uterine fibroids. BTL. Normal pap history.   Gynecologic History Patient's last menstrual period was 06/15/2020. Period Cycle (Days): 21 Period Duration (Days): 7 Period Pattern: Regular Menstrual Flow: Heavy,Moderate Dysmenorrhea: (!) Moderate Dysmenorrhea Symptoms: Cramping Contraception/Family planning: tubal ligation  Health Maintenance Last Pap: 2018. Results were: normal Last mammogram: Not indicated Last colonoscopy: Not indicated Last Dexa: Not indicated   Past medical history, past surgical history, family history and social history were all reviewed and documented in the EPIC chart. 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   ROS:  A ROS was performed and pertinent positives and negatives are included.  Exam:  Vitals:   06/22/20 1012  BP: 118/74  Weight: 171 lb (77.6 kg)  Height: 5\' 7"  (1.702 m)   Body mass index is 26.78 kg/m.  General appearance:  Normal Thyroid:  Symmetrical, normal in size, without palpable masses or nodularity. Respiratory  Auscultation:  Clear without wheezing or rhonchi Cardiovascular  Auscultation:  Regular rate, without rubs, murmurs or gallops  Edema/varicosities:  Not grossly evident Abdominal  Soft,nontender, without masses, guarding or rebound.  Liver/spleen:  No organomegaly noted  Hernia:  None appreciated  Skin  Inspection:  Grossly normal Breasts: Examined lying and sitting.   Right: Without masses, retractions, nipple discharge or axillary adenopathy.   Left: Without masses, retractions, nipple discharge or axillary adenopathy. Genitourinary   Inguinal/mons:  Normal without inguinal  adenopathy  External genitalia:  Normal appearing vulva with no masses, tenderness, or lesions  BUS/Urethra/Skene's glands:  Normal  Vagina:  Normal appearing with normal color and discharge, no lesions  Cervix:  Normal appearing without discharge or lesions  Uterus:  Normal in size, shape and contour.  Midline and mobile, nontender  Adnexa/parametria:     Rt: Normal in size, without masses or tenderness.   Lt: Normal in size, without masses or tenderness.  Anus and perineum: Normal  Assessment/Plan:  40 y.o. G2P2 for annual exam.   Well female exam with routine gynecological exam - Plan: Cytology - PAP( [facility]). Education provided on SBEs, importance of preventative screenings, current guidelines, high calcium diet, regular exercise, and multivitamin daily. Labs with PCP.   Unusually frequent menses - Plan: 24 PELVIS TRANSVAGINAL NON-OB (TV ONLY). Having cycles every 3 weeks for about 1 year. Prior to this cycles were every 28 days. First 2 days are heavy with clots, then 3 days of light bleeding, then 1-2 days of spotting with total bleeding time of 7 days. Mother and sister with uterine fibroids. We did discuss that this could be her new norm and that cycles can start to change closer to age 59. But because this is a change for her over the past year we will evaluate with a pelvic ultrasound.   Screening for cervical cancer - normal pap history. Pap with HPV today.  Return in 1 year for annual.    41 DNP, 10:23 AM 06/22/2020

## 2020-06-22 NOTE — Patient Instructions (Signed)
Health Maintenance, Female Adopting a healthy lifestyle and getting preventive care are important in promoting health and wellness. Ask your health care provider about:  The right schedule for you to have regular tests and exams.  Things you can do on your own to prevent diseases and keep yourself healthy. What should I know about diet, weight, and exercise? Eat a healthy diet  Eat a diet that includes plenty of vegetables, fruits, low-fat dairy products, and lean protein.  Do not eat a lot of foods that are high in solid fats, added sugars, or sodium.   Maintain a healthy weight Body mass index (BMI) is used to identify weight problems. It estimates body fat based on height and weight. Your health care provider can help determine your BMI and help you achieve or maintain a healthy weight. Get regular exercise Get regular exercise. This is one of the most important things you can do for your health. Most adults should:  Exercise for at least 150 minutes each week. The exercise should increase your heart rate and make you sweat (moderate-intensity exercise).  Do strengthening exercises at least twice a week. This is in addition to the moderate-intensity exercise.  Spend less time sitting. Even light physical activity can be beneficial. Watch cholesterol and blood lipids Have your blood tested for lipids and cholesterol at 40 years of age, then have this test every 5 years. Have your cholesterol levels checked more often if:  Your lipid or cholesterol levels are high.  You are older than 40 years of age.  You are at high risk for heart disease. What should I know about cancer screening? Depending on your health history and family history, you may need to have cancer screening at various ages. This may include screening for:  Breast cancer.  Cervical cancer.  Colorectal cancer.  Skin cancer.  Lung cancer. What should I know about heart disease, diabetes, and high blood  pressure? Blood pressure and heart disease  High blood pressure causes heart disease and increases the risk of stroke. This is more likely to develop in people who have high blood pressure readings, are of African descent, or are overweight.  Have your blood pressure checked: ? Every 3-5 years if you are 18-39 years of age. ? Every year if you are 40 years old or older. Diabetes Have regular diabetes screenings. This checks your fasting blood sugar level. Have the screening done:  Once every three years after age 40 if you are at a normal weight and have a low risk for diabetes.  More often and at a younger age if you are overweight or have a high risk for diabetes. What should I know about preventing infection? Hepatitis B If you have a higher risk for hepatitis B, you should be screened for this virus. Talk with your health care provider to find out if you are at risk for hepatitis B infection. Hepatitis C Testing is recommended for:  Everyone born from 1945 through 1965.  Anyone with known risk factors for hepatitis C. Sexually transmitted infections (STIs)  Get screened for STIs, including gonorrhea and chlamydia, if: ? You are sexually active and are younger than 40 years of age. ? You are older than 40 years of age and your health care provider tells you that you are at risk for this type of infection. ? Your sexual activity has changed since you were last screened, and you are at increased risk for chlamydia or gonorrhea. Ask your health care provider   if you are at risk.  Ask your health care provider about whether you are at high risk for HIV. Your health care provider may recommend a prescription medicine to help prevent HIV infection. If you choose to take medicine to prevent HIV, you should first get tested for HIV. You should then be tested every 3 months for as long as you are taking the medicine. Pregnancy  If you are about to stop having your period (premenopausal) and  you may become pregnant, seek counseling before you get pregnant.  Take 400 to 800 micrograms (mcg) of folic acid every day if you become pregnant.  Ask for birth control (contraception) if you want to prevent pregnancy. Osteoporosis and menopause Osteoporosis is a disease in which the bones lose minerals and strength with aging. This can result in bone fractures. If you are 65 years old or older, or if you are at risk for osteoporosis and fractures, ask your health care provider if you should:  Be screened for bone loss.  Take a calcium or vitamin D supplement to lower your risk of fractures.  Be given hormone replacement therapy (HRT) to treat symptoms of menopause. Follow these instructions at home: Lifestyle  Do not use any products that contain nicotine or tobacco, such as cigarettes, e-cigarettes, and chewing tobacco. If you need help quitting, ask your health care provider.  Do not use street drugs.  Do not share needles.  Ask your health care provider for help if you need support or information about quitting drugs. Alcohol use  Do not drink alcohol if: ? Your health care provider tells you not to drink. ? You are pregnant, may be pregnant, or are planning to become pregnant.  If you drink alcohol: ? Limit how much you use to 0-1 drink a day. ? Limit intake if you are breastfeeding.  Be aware of how much alcohol is in your drink. In the [place] one drink equals one 12 oz bottle of beer (355 mL), one 5 oz glass of wine (148 mL), or one 1 oz glass of hard liquor (44 mL). General instructions  Schedule regular health, dental, and eye exams.  Stay current with your vaccines.  Tell your health care provider if: ? You often feel depressed. ? You have ever been abused or do not feel safe at home. Summary  Adopting a healthy lifestyle and getting preventive care are important in promoting health and wellness.  Follow your health care provider's instructions about healthy  diet, exercising, and getting tested or screened for diseases.  Follow your health care provider's instructions on monitoring your cholesterol and blood pressure. This information is not intended to replace advice given to you by your health care provider. Make sure you discuss any questions you have with your health care provider. Document Revised: 01/10/2018 Document Reviewed: 01/10/2018 Elsevier Patient Education  2021 [employer]  

## 2020-06-23 LAB — CYTOLOGY - PAP
Comment: NEGATIVE
Diagnosis: NEGATIVE
High risk HPV: NEGATIVE

## 2020-07-07 ENCOUNTER — Other Ambulatory Visit: Payer: Self-pay

## 2020-07-07 ENCOUNTER — Ambulatory Visit: Payer: BC Managed Care – PPO | Admitting: Obstetrics and Gynecology

## 2020-07-07 ENCOUNTER — Ambulatory Visit (INDEPENDENT_AMBULATORY_CARE_PROVIDER_SITE_OTHER): Payer: BC Managed Care – PPO

## 2020-07-07 ENCOUNTER — Encounter: Payer: Self-pay | Admitting: Obstetrics and Gynecology

## 2020-07-07 VITALS — BP 98/58 | HR 72 | Resp 14 | Ht 67.0 in | Wt 170.0 lb

## 2020-07-07 DIAGNOSIS — N920 Excessive and frequent menstruation with regular cycle: Secondary | ICD-10-CM

## 2020-07-07 DIAGNOSIS — Z862 Personal history of diseases of the blood and blood-forming organs and certain disorders involving the immune mechanism: Secondary | ICD-10-CM

## 2020-07-07 DIAGNOSIS — G43821 Menstrual migraine, not intractable, with status migrainosus: Secondary | ICD-10-CM | POA: Diagnosis not present

## 2020-07-07 DIAGNOSIS — N943 Premenstrual tension syndrome: Secondary | ICD-10-CM | POA: Diagnosis not present

## 2020-07-07 MED ORDER — NORETHIN ACE-ETH ESTRAD-FE 1-20 MG-MCG PO TABS: 1.0000 | ORAL_TABLET | Freq: Every day | ORAL | 0 refills | Status: DC

## 2020-07-07 NOTE — Progress Notes (Signed)
GYNECOLOGY  VISIT   HPI: 40 y.o.   Married Black or [place] American Not Hispanic or Latino  female   G2P2 with Patient's last menstrual period was 06/15/2020.   here for an ultrasound for evaluation of cycle changes.    Menses q 3 weeks x 7 days. 2 days are very heavy, she can saturate a super tampon in up to 1 hour. Cramps are bad for one day. Bad headaches prior to her cycles and for the first 1-2 days. Bad mood swings for 3 days prior to her cycle.  Migraines without aura.  Non smoker. No h/o clotting disorder.   No thyroid c/o.   Chart reviewed Last CBC in 8/21 with HGB of 9.4.   GYNECOLOGIC HISTORY: Patient's last menstrual period was 06/15/2020. Contraception:BTL  Menopausal hormone therapy: none        OB History    Gravida  2   Para  2   Term      Preterm      AB      Living  2     SAB      IAB      Ectopic      Multiple      Live Births                 Patient Active Problem List   Diagnosis Date Noted  . Menstrual migraine 04/01/2019    Past Medical History:  Diagnosis Date  . Carpal tunnel syndrome   . Migraine without aura     Past Surgical History:  Procedure Laterality Date  . CESAREAN SECTION    . TUBAL LIGATION      Current Outpatient Medications  Medication Sig Dispense Refill  . Calcium Carbonate-Vit D-Min (CALCIUM 1200 PO) Take by mouth.    . cholecalciferol (VITAMIN D3) 25 MCG (1000 UNIT) tablet Take 1,000 Units by mouth daily.    . Multiple Vitamin (MULTIVITAMIN) capsule Take 1 capsule by mouth daily.     No current facility-administered medications for this visit.     ALLERGIES: Patient has no known allergies.  Family History  Problem Relation Age of Onset  . Diabetes Father   . Heart disease Father   . Cancer Paternal Grandfather        Lung    Social History   Socioeconomic History  . Marital status: Married    Spouse name: Not on file  . Number of children: Not on file  . Years of education: Not on  file  . Highest education level: Not on file  Occupational History  . Not on file  Tobacco Use  . Smoking status: Never Smoker  . Smokeless tobacco: Never Used  Vaping Use  . Vaping Use: Never used  Substance and Sexual Activity  . Alcohol use: Yes    Alcohol/week: 7.0 standard drinks    Types: 7 Standard drinks or equivalent per week  . Drug use: Never  . Sexual activity: Yes    Birth control/protection: Surgical    Comment: BTL-1st intercourse 40 yo-More than 5 partners  Other Topics Concern  . Not on file  Social History Narrative  . Not on file   Social Determinants of Health   Financial Resource Strain: Not on file  Food Insecurity: Not on file  Transportation Needs: Not on file  Physical Activity: Not on file  Stress: Not on file  Social Connections: Not on file  Intimate Partner Violence: Not on file    Review  of Systems  All other systems reviewed and are negative.   PHYSICAL EXAMINATION:    BP (!) 98/58 (BP Location: Right Arm, Patient Position: Sitting, Cuff Size: Normal)   Pulse 72   Resp 14   Ht 5\' 7"  (1.702 m)   Wt 170 lb (77.1 kg)   LMP 06/15/2020   BMI 26.63 kg/m     General appearance: alert, cooperative and appears stated age  Pelvic ultrasound  Indications: Menorrhagia, menstrual changes  Findings:  Anteverted Uterus 8.37 x 4.62 x 4.25 cm  Endometrium 13.66 mm, uniform, symmetrical, no masses  Left ovary 3.38 x 1.62 x 1.52 cm  1.45 x 0.98 cm follicle  Right ovary 2.28 x 1.64 x 1.14 cm  No free fluid   Impression:  Normal sized anteverted uterus No myometrial masses Thickened, but uniform, symmetric endometrium. No endometrial masses. Normal ovaries bilaterally.   1. Menorrhagia with regular cycle Thickened, uniform endometrium, otherwise normal ultrasound - CBC; Future - TSH; Future - Ferritin; Future - norethindrone-ethinyl estradiol-FE (LOESTRIN FE) 1-20 MG-MCG tablet; Take 1 tablet by mouth daily.  Dispense: 84 tablet;  Refill: 0 -No contraindications to OCP's, risks reviewed -F/U in 3 months  2. History of anemia - CBC; Future - Ferritin; Future - norethindrone-ethinyl estradiol-FE (LOESTRIN FE) 1-20 MG-MCG tablet; Take 1 tablet by mouth daily.  Dispense: 84 tablet; Refill: 0  3. PMS (premenstrual syndrome) - norethindrone-ethinyl estradiol-FE (LOESTRIN FE) 1-20 MG-MCG tablet; Take 1 tablet by mouth daily.  Dispense: 84 tablet; Refill: 0 -F/U in 3 months  4. Menstrual migraine with status migrainosus, not intractable -Will see how she does on cyclic OCP's, consider continuous OCP's depending on her bleeding.   In addition to reviewing the ultrasound images, ~20 minutes was spent in total patient care in regards to managing her cycles/pms/migraines.  -F/U in 3 months  CC: 06/17/2020, NP

## 2020-07-07 NOTE — Patient Instructions (Addendum)
Premenstrual Syndrome Premenstrual syndrome (PMS) is a group of physical, emotional, and behavioral symptoms that affect women as part of their menstrual cycle. PMS occurs 1-2 weeks before the start of a woman's menstrual period and goes away a few days after menstrual bleeding begins. PMS can range from mild to severe. What are the causes? The exact cause of this condition is not known, but it seems to be related to hormone changes that happen before menstruation. What are the signs or symptoms? Symptoms of this condition often happen every month. They go away after your period starts. Physical symptoms of this condition include:  Bloating.  Breast pain or tenderness.  Headaches.  Extreme fatigue.  Backaches.  Swelling of the hands and feet.  Weight gain.  Hot flashes. Emotional symptoms of this condition include:  Mood swings.  Depression.  Angry or hostile outbursts.  Irritability.  Anxiety.  Crying spells. Behavioral symptoms include:  Food cravings or appetite changes.  Changes in sexual desire.  Confusion.  Social withdrawal.  Poor concentration. How is this diagnosed? This condition may be diagnosed based on a history of your symptoms. This condition is generally diagnosed if symptoms of PMS:  Are present in the 5 days before your period starts.  End within 4 days after your period starts.  Happen at least 3 months in a row.  Interfere with some of your normal activities. Other conditions that can cause some of these symptoms must be ruled out before PMS can be diagnosed. These include depression, anxiety, anemia, and thyroid problems. How is this treated? This condition may be treated by doing the following:  Maintaining a healthy lifestyle. This includes eating a well-balanced diet and exercising regularly.  Taking over-the-counter medicines that can help relieve symptoms, such as cramps, aches, pain, headaches, and breast tenderness. Follow  these instructions at home: Eating and drinking  Eat a well-balanced diet.  Avoid caffeine and alcohol.  Limit the amount of salt and salty foods you eat. This will help reduce bloating.  Drink enough fluid to keep your urine pale yellow.  Take a multivitamin if told to do so by your health care provider.   Lifestyle  Do not use any products that contain nicotine or tobacco. These products include cigarettes, chewing tobacco, and vaping devices, such as e-cigarettes. If you need help quitting, ask your health care provider.  Exercise regularly as suggested by your health care provider.  Get enough sleep. For most adults, this is 7-8 hours of sleep each night.  Practice relaxation techniques, such as yoga, tai chi, or meditation.  Find healthy ways to manage stress.   General instructions  For 2-3 months, write down your symptoms, whether they are mild to severe, and how long they last. This will help your health care provider choose the best treatment for you.  Take over-the-counter and prescription medicines only as told by your health care provider.  If you are using birth control pills (oral contraceptives), use them as told by your health care provider.   Contact a health care provider if:  Your symptoms get worse.  You develop new symptoms.  You have trouble doing your daily activities. Summary  Premenstrual syndrome (PMS) is a group of physical, emotional, and behavioral symptoms that affect women as part of their menstrual cycle.  PMS starts 1-2 weeks before the start of a woman's period and goes away a few days after the period starts.  PMS is treated by maintaining a healthy lifestyle and taking medicines   to relieve the symptoms. This information is not intended to replace advice given to you by your health care provider. Make sure you discuss any questions you have with your health care provider. Document Revised: 09/06/2019 Document Reviewed:  09/06/2019 Elsevier Patient Education  2021 Elsevier Inc. Oral Contraception Information Oral contraceptive pills (OCPs) are medicines taken by mouth to prevent pregnancy. They work by:  Preventing the ovaries from releasing eggs.  Thickening mucus in the lower part of the uterus (cervix). This prevents sperm from entering the uterus.  Thinning the lining of the uterus (endometrium). This prevents a fertilized egg from attaching to the endometrium. OCPs are highly effective when taken exactly as prescribed. However, OCPs do not prevent STIs (sexually transmitted infections). Using condoms while on an OCP can help prevent STIs. What happens before starting OCPs? Before you start taking OCPs:  You may have a physical exam, blood test, and Pap test.  Your health care provider will make sure you are a good candidate for oral contraception. OCPs are not a good option for certain women, such as: ? Women who smoke and are older than age 35. ? Women who have or have had certain conditions, such as:  A history of high blood pressure.  Deep vein thrombosis.  Pulmonary embolism.  Stroke.  Cardiovascular disease.  Peripheral vascular disease. Ask your health care provider about the possible side effects of the OCP you may be prescribed. Be aware that it can take 2-3 months for your body to adjust to changes in hormone levels. Types of oral contraception Birth control pills contain the hormones estrogen and progestin (synthetic progesterone) or progestin only. The combination pill This type of pill contains estrogen and progestin hormones.  Conventional contraception pills come in packs of 21 or 28 pills. ? Some packs with 28-day pills contain estrogen and progestin for the first 21-24 days. Hormone-free tablets, called placebos, are taken for the final 4-7 days. You should have menstrual bleeding during the time you take the placebos. ? In packs with 21 tablets, you take no pills for 7  days. Menstrual bleeding occurs during these days. (Some people prefer taking a pill for 28 days to help establish a routine).  Extended-interval contraception pills come in packs of 91 pills. The first 84 tablets have both estrogen and progestin. The last 7 pills are placebos. Menstrual bleeding occurs during the placebo days. With this schedule, menstrual bleeding happens once every 3 months.  Continuous contraception pills come in packs of 28 pills. All pills in the pack contain estrogen and progestin. With this schedule, regular menstrual bleeding does not happen, but there may be spotting or irregular bleeding. Progestin-only pills This type of pill is often called the mini-pill and contains the progestin hormone only. It comes in packs of 28 pills. In some packs, the last 4 pills are placebos. The pill must be taken at the same time every day. This is very important to prevent pregnancy. Menstrual bleeding may not be regular or predictable.   What are the advantages? Oral contraception provides reliable and continuous contraception if taken as directed. It may treat or decrease symptoms of:  Menstrual period cramps.  Irregular menstrual cycle or bleeding.  Heavy menstrual flow.  Abnormal uterine bleeding.  Acne, depending on the type of pill.  Polycystic ovarian syndrome (POS).  Endometriosis.  Iron deficiency anemia.  Premenstrual symptoms, including severe irritability, depression, or anxiety. It also may:  Reduce the risk of endometrial and ovarian cancer.  Be used   as emergency contraception.  Prevent ectopic pregnancies and infections of the fallopian tubes. What can make OCPs less effective? OCPs may be less effective if:  You forget to take the pill every day. For progestin-only pills, it is especially important to take the pill at the same time each day. Even taking it 3 hours late can increase the risk of pregnancy.  You have a stomach or intestinal disease that  reduces your body's ability to absorb the pill.  You take OCPs with other medicines that make OCPs less effective, such as antibiotics, certain HIV medicines, and some seizure medicines.  You take expired OCPs.  You forget to restart the pill after 7 days of not taking it. This refers to the packs of 21 pills. What are the side effects and risks? OCPs can sometimes cause side effects, such as:  Headache.  Depression.  Trouble sleeping.  Nausea and vomiting.  Breast tenderness.  Irregular bleeding or spotting during the first several months.  Bloating or fluid retention.  Increase in blood pressure. Combination pills may slightly increase the risk of:  Blood clots.  Heart attack.  Stroke. Follow these instructions at home: Follow instructions from your health care provider about how to start taking your first cycle of OCPs. Depending on when you start the pill, you may need to use a backup form of birth control, such as condoms, during the first week. Make sure you know what steps to take if you forget to take the pill. Summary  Oral contraceptive pills (OCPs) are medicines taken by mouth to prevent pregnancy. They are highly effective when taken exactly as prescribed.  OCPs contain a combination of the hormones estrogen and progestin (synthetic progesterone) or progestin only.  Before you start taking the pill, you may have a physical exam, blood test, and Pap test. Your health care provider will make sure you are a good candidate for oral contraception.  The combination pill may come in a 21-day pack, a 28-day pack, or a 91-day pack. Progestin-only pills come in packs of 28 pills.  OCPs can sometimes cause side effects, such as headache, nausea, breast tenderness, or irregular bleeding. This information is not intended to replace advice given to you by your health care provider. Make sure you discuss any questions you have with your health care provider. Document  Revised: 10/18/2019 Document Reviewed: 09/26/2019 Elsevier Patient Education  2021 Elsevier Inc.  

## 2020-07-13 ENCOUNTER — Other Ambulatory Visit: Payer: BC Managed Care – PPO

## 2020-07-13 ENCOUNTER — Other Ambulatory Visit: Payer: Self-pay

## 2020-07-13 DIAGNOSIS — Z862 Personal history of diseases of the blood and blood-forming organs and certain disorders involving the immune mechanism: Secondary | ICD-10-CM

## 2020-07-13 DIAGNOSIS — N920 Excessive and frequent menstruation with regular cycle: Secondary | ICD-10-CM

## 2020-07-14 ENCOUNTER — Encounter: Payer: Self-pay | Admitting: Obstetrics and Gynecology

## 2020-07-14 LAB — CBC
HCT: 29.8 % — ABNORMAL LOW (ref 35.0–45.0)
Hemoglobin: 8.6 g/dL — ABNORMAL LOW (ref 11.7–15.5)
MCH: 22.3 pg — ABNORMAL LOW (ref 27.0–33.0)
MCHC: 28.9 g/dL — ABNORMAL LOW (ref 32.0–36.0)
MCV: 77.2 fL — ABNORMAL LOW (ref 80.0–100.0)
MPV: 11.1 fL (ref 7.5–12.5)
Platelets: 302 10*3/uL (ref 140–400)
RBC: 3.86 10*6/uL (ref 3.80–5.10)
RDW: 17.5 % — ABNORMAL HIGH (ref 11.0–15.0)
WBC: 6.9 10*3/uL (ref 3.8–10.8)

## 2020-07-14 LAB — FERRITIN: Ferritin: 2 ng/mL — ABNORMAL LOW (ref 16–154)

## 2020-07-14 LAB — TSH: TSH: 1.51 mIU/L

## 2020-07-15 ENCOUNTER — Telehealth: Payer: Self-pay | Admitting: *Deleted

## 2020-07-15 DIAGNOSIS — D500 Iron deficiency anemia secondary to blood loss (chronic): Secondary | ICD-10-CM

## 2020-07-15 NOTE — Telephone Encounter (Signed)
Message left to return call to Camree Wigington at [phone].   

## 2020-07-15 NOTE — Progress Notes (Signed)
LM 6-15

## 2020-07-15 NOTE — Telephone Encounter (Signed)
-----   Message from Patton Salles, MD sent at 07/14/2020  7:29 PM EDT ----- This is Dr. Edward Jolly reviewing labs for Dr. Oscar La.  The patient has anemia.  Her hemoglobin is low at 8.6 and her iron storage called ferritin is also low at 2. Her thyroid function is normal. I recommend she start taking over the counter iron sulfate 325 mg by mouth three times a day with meals and return to the office in 4 weeks to recheck her labs.  Please place future orders for CBC and ferritin with Dr. Oscar La.

## 2020-07-15 NOTE — Telephone Encounter (Signed)
Spoke with patient and advised her regarding result note and recommendations. Orders placed and lab appointment scheduled for patient.

## 2020-07-21 ENCOUNTER — Telehealth: Payer: Self-pay

## 2020-07-21 NOTE — Telephone Encounter (Signed)
Opened in error

## 2020-08-12 ENCOUNTER — Other Ambulatory Visit: Payer: Self-pay

## 2020-08-12 ENCOUNTER — Encounter: Payer: Self-pay | Admitting: Obstetrics and Gynecology

## 2020-08-12 ENCOUNTER — Ambulatory Visit: Payer: BC Managed Care – PPO | Admitting: Obstetrics and Gynecology

## 2020-08-12 VITALS — BP 126/62 | HR 75 | Ht 67.0 in | Wt 168.0 lb

## 2020-08-12 DIAGNOSIS — B9689 Other specified bacterial agents as the cause of diseases classified elsewhere: Secondary | ICD-10-CM

## 2020-08-12 DIAGNOSIS — Z113 Encounter for screening for infections with a predominantly sexual mode of transmission: Secondary | ICD-10-CM

## 2020-08-12 DIAGNOSIS — N760 Acute vaginitis: Secondary | ICD-10-CM

## 2020-08-12 DIAGNOSIS — R102 Pelvic and perineal pain: Secondary | ICD-10-CM | POA: Diagnosis not present

## 2020-08-12 DIAGNOSIS — D500 Iron deficiency anemia secondary to blood loss (chronic): Secondary | ICD-10-CM

## 2020-08-12 DIAGNOSIS — N898 Other specified noninflammatory disorders of vagina: Secondary | ICD-10-CM | POA: Diagnosis not present

## 2020-08-12 LAB — WET PREP FOR TRICH, YEAST, CLUE

## 2020-08-12 MED ORDER — METRONIDAZOLE 500 MG PO TABS: 500.0000 mg | ORAL_TABLET | Freq: Two times a day (BID) | ORAL | 0 refills | Status: DC

## 2020-08-12 NOTE — Patient Instructions (Signed)
Bacterial Vaginosis °Bacterial vaginosis is an infection that occurs when the normal balance of bacteria in the vagina changes. This change is caused by an overgrowth of certain bacteria in the vagina. Bacterial vaginosis is the most common vaginal infection among females aged 40 to 44 years. °This condition increases the risk of sexually transmitted infections (STIs). Treatment can help reduce this risk. Treatment is very important for pregnant women because this condition can cause babies to be born early (prematurely) or at a low birth weight. °What are the causes? °This condition is caused by an increase in harmful bacteria that are normally present in small amounts in the vagina. However, the exact reason this condition develops is not known. °You cannot get bacterial vaginosis from toilet seats, bedding, swimming pools, or contact with objects around you. °What increases the risk? °The following factors may make you more likely to develop this condition: °Having a new sexual partner or multiple sexual partners, or having unprotected sex. °Douching. °Having an intrauterine device (IUD). °Smoking. °Abusing drugs and alcohol. This may lead to riskier sexual behavior. °Taking certain antibiotic medicines. °Being pregnant. °What are the signs or symptoms? °Some women with this condition have no symptoms. Symptoms may include: °Gray or white vaginal discharge. The discharge can be watery or foamy. °A fish-like odor with discharge, especially after sex or during menstruation. °Itching in and around the vagina. °Burning or pain with urination. °How is this diagnosed? °This condition is diagnosed based on: °Your medical history. °A physical exam of the vagina. °Checking a sample of vaginal fluid for harmful bacteria or abnormal cells. °How is this treated? °This condition is treated with antibiotic medicines. These may be given as a pill, a vaginal cream, or a medicine that is put into the vagina (suppository). If the  condition comes back after treatment, a second round of antibiotics may be needed. °Follow these instructions at home: °Medicines °Take or apply over-the-counter and prescription medicines only as told by your health care provider. °Take or apply your antibiotic medicine as told by your health care provider. Do not stop using the antibiotic even if you start to feel better. °General instructions °If you have a female sexual partner, tell her that you have a vaginal infection. She should follow up with her health care provider. If you have a female sexual partner, he does not need treatment. °Avoid sexual activity until you finish treatment. °Drink enough fluid to keep your urine pale yellow. °Keep the area around your vagina and rectum clean. °Wash the area daily with warm water. °Wipe yourself from front to back after using the toilet. °If you are breastfeeding, talk to your health care provider about continuing breastfeeding during treatment. °Keep all follow-up visits. This is important. °How is this prevented? °Self-care °Do not douche. °Wash the outside of your vagina with warm water only. °Wear cotton or cotton-lined underwear. °Avoid wearing tight pants and pantyhose, especially during the summer. °Safe sex °Use protection when having sex. This includes: °Using condoms. °Using dental dams. This is a thin layer of a material made of latex or polyurethane that protects the mouth during oral sex. °Limit the number of sexual partners. To help prevent bacterial vaginosis, it is best to have sex with just one partner (monogamous relationship). °Make sure you and your sexual partner are tested for STIs. °Drugs and alcohol °Do not use any products that contain nicotine or tobacco. These products include cigarettes, chewing tobacco, and vaping devices, such as e-cigarettes. If you need help quitting,   ask your health care provider. °Do not use drugs. °Do not drink alcohol if: °Your health care provider tells you not to  do this. °You are pregnant, may be pregnant, or are planning to become pregnant. °If you drink alcohol: °Limit how much you have to 0-1 drink a day. °Be aware of how much alcohol is in your drink. In the [place] one drink equals one 12 oz bottle of beer (355 mL), one 5 oz glass of wine (148 mL), or one 1½ oz glass of hard liquor (44 mL). °Where to find more information °Centers for Disease Control and Prevention: www.cdc.gov °American Sexual Health Association (ASHA): www.ashastd.org °[place] Department of Health and Human Services, Office on Women's Health: www.womenshealth.gov °Contact a health care provider if: °Your symptoms do not improve, even after treatment. °You have more discharge or pain when urinating. °You have a fever or chills. °You have pain in your abdomen or pelvis. °You have pain during sex. °You have vaginal bleeding between menstrual periods. °Summary °Bacterial vaginosis is a vaginal infection that occurs when the normal balance of bacteria in the vagina changes. It results from an overgrowth of certain bacteria. °This condition increases the risk of sexually transmitted infections (STIs). Getting treated can help reduce this risk. °Treatment is very important for pregnant women because this condition can cause babies to be born early (prematurely) or at low birth weight. °This condition is treated with antibiotic medicines. These may be given as a pill, a vaginal cream, or a medicine that is put into the vagina (suppository). °This information is not intended to replace advice given to you by your health care provider. Make sure you discuss any questions you have with your health care provider. °Document Revised: 07/18/2019 Document Reviewed: 07/18/2019 °Elsevier Patient Education © 2022 [employer] Inc. ° °

## 2020-08-12 NOTE — Progress Notes (Signed)
GYNECOLOGY  VISIT   HPI: 40 y.o.   Married Black or [place] American Not Hispanic or Latino  female   G2P2 with Patient's last menstrual period was 08/03/2020.   here for vaginal odor and abdominal cramping   She c/o a 2 day h/o vaginal odor. No increase in vaginal d/c. She has a slight, nagging, constant ache in her lower, mid abdomen. No bowel or bladder c/o.  No STD concerns.  She was started on OCP's last month for menorrhagia. Just had her fist cycle last week, it was better than her normal cycle. Less heavy, she only changed her pad 4 x in a day, smaller clots, mild cramps. Definitely and improvement.   GYNECOLOGIC HISTORY: Patient's last menstrual period was 08/03/2020. Contraception: BTL, OCP's Menopausal hormone therapy: NA        OB History     Gravida  2   Para  2   Term      Preterm      AB      Living  2      SAB      IAB      Ectopic      Multiple      Live Births                 Patient Active Problem List   Diagnosis Date Noted   Menstrual migraine 04/01/2019    Past Medical History:  Diagnosis Date   Anemia    Carpal tunnel syndrome    Migraine without aura     Past Surgical History:  Procedure Laterality Date   CESAREAN SECTION     TUBAL LIGATION      Current Outpatient Medications  Medication Sig Dispense Refill   Calcium Carbonate-Vit D-Min (CALCIUM 1200 PO) Take by mouth.     cholecalciferol (VITAMIN D3) 25 MCG (1000 UNIT) tablet Take 1,000 Units by mouth daily.     Multiple Vitamin (MULTIVITAMIN) capsule Take 1 capsule by mouth daily.     norethindrone-ethinyl estradiol-FE (LOESTRIN FE) 1-20 MG-MCG tablet Take 1 tablet by mouth daily. 84 tablet 0   No current facility-administered medications for this visit.     ALLERGIES: Patient has no known allergies.  Family History  Problem Relation Age of Onset   Diabetes Father    Heart disease Father    Cancer Paternal Grandfather        Lung    Social History    Socioeconomic History   Marital status: Married    Spouse name: Not on file   Number of children: Not on file   Years of education: Not on file   Highest education level: Not on file  Occupational History   Not on file  Tobacco Use   Smoking status: Never   Smokeless tobacco: Never  Vaping Use   Vaping Use: Never used  Substance and Sexual Activity   Alcohol use: Yes    Alcohol/week: 7.0 standard drinks    Types: 7 Standard drinks or equivalent per week   Drug use: Never   Sexual activity: Yes    Birth control/protection: Surgical    Comment: BTL-1st intercourse 40 yo-More than 5 partners  Other Topics Concern   Not on file  Social History Narrative   Not on file   Social Determinants of Health   Financial Resource Strain: Not on file  Food Insecurity: Not on file  Transportation Needs: Not on file  Physical Activity: Not on file  Stress: Not on  file  Social Connections: Not on file  Intimate Partner Violence: Not on file    Review of Systems  Gastrointestinal:  Positive for abdominal pain.  All other systems reviewed and are negative.  PHYSICAL EXAMINATION:    BP 126/62   Pulse 75   Ht 5\' 7"  (1.702 m)   Wt 168 lb (76.2 kg)   LMP 08/03/2020   SpO2 100%   BMI 26.31 kg/m     General appearance: alert, cooperative and appears stated age Abdomen: soft, mildly tender in the SP region, no rebound, no guarding; non distended, no masses,  no organomegaly  Pelvic: External genitalia:  no lesions              Urethra:  normal appearing urethra with no masses, tenderness or lesions              Bartholins and Skenes: normal                 Vagina: normal appearing vagina with a slight increase in watery, frothy vaginal discharge              Cervix: no cervical motion tenderness and no lesions              Bimanual Exam:  Uterus:   anteverted, mobile, normal sized, +/- tender              Adnexa: no mass, fullness, tenderness               Chaperone was present  for exam.  1. Vaginal odor - WET PREP FOR TRICH, YEAST, CLUE: +BV  2. Vaginal discharge - WET PREP FOR TRICH, YEAST, CLUE: +BV  3. Bacterial vaginitis - metroNIDAZOLE (FLAGYL) 500 MG tablet; Take 1 tablet (500 mg total) by mouth 2 (two) times daily.  Dispense: 14 tablet; Refill: 0  4. Pelvic cramping Mildly tender in lower abdomen, equivocal uterine tenderness. No bowel or bladder c/o. +BV. Will treat BV with oral flagy - SURESWAB CT/NG/T. vaginalis  5. Screening examination for STD (sexually transmitted disease) - SURESWAB CT/NG/T. vaginalis

## 2020-08-13 LAB — CBC WITH DIFFERENTIAL/PLATELET
Absolute Monocytes: 452 cells/uL (ref 200–950)
Basophils Absolute: 62 cells/uL (ref 0–200)
Basophils Relative: 1.2 %
Eosinophils Absolute: 151 cells/uL (ref 15–500)
Eosinophils Relative: 2.9 %
HCT: 32.2 % — ABNORMAL LOW (ref 35.0–45.0)
Hemoglobin: 9.1 g/dL — ABNORMAL LOW (ref 11.7–15.5)
Lymphs Abs: 1690 cells/uL (ref 850–3900)
MCH: 22 pg — ABNORMAL LOW (ref 27.0–33.0)
MCHC: 28.3 g/dL — ABNORMAL LOW (ref 32.0–36.0)
MCV: 78 fL — ABNORMAL LOW (ref 80.0–100.0)
MPV: 11.4 fL (ref 7.5–12.5)
Monocytes Relative: 8.7 %
Neutro Abs: 2844 cells/uL (ref 1500–7800)
Neutrophils Relative %: 54.7 %
Platelets: 341 10*3/uL (ref 140–400)
RBC: 4.13 10*6/uL (ref 3.80–5.10)
RDW: 19.2 % — ABNORMAL HIGH (ref 11.0–15.0)
Total Lymphocyte: 32.5 %
WBC: 5.2 10*3/uL (ref 3.8–10.8)

## 2020-08-13 LAB — SURESWAB CT/NG/T. VAGINALIS
C. trachomatis RNA, TMA: NOT DETECTED
N. gonorrhoeae RNA, TMA: NOT DETECTED
Trichomonas vaginalis RNA: NOT DETECTED

## 2020-08-13 LAB — FERRITIN: Ferritin: 3 ng/mL — ABNORMAL LOW (ref 16–154)

## 2020-08-14 ENCOUNTER — Telehealth: Payer: Self-pay | Admitting: *Deleted

## 2020-08-14 DIAGNOSIS — Z862 Personal history of diseases of the blood and blood-forming organs and certain disorders involving the immune mechanism: Secondary | ICD-10-CM

## 2020-08-14 NOTE — Telephone Encounter (Signed)
Patient informed with below note, she declined the iron transfusion/referral. Reports she is taking 325 mg of iron 1 po daily, but will increase to twice daily and take a stool softer. States when she tried the 325 mg tablet 3 times daily it made her so constipated she couldn't continue taking this frequent.

## 2020-08-14 NOTE — Telephone Encounter (Signed)
-----   Message from Romualdo Bolk, MD sent at 08/14/2020 10:56 AM EDT ----- Please let the patient know that her cervical cultures are negative. She continues to be anemic, please set her up for a hematology consultation for a possible iron transfusion

## 2020-08-18 NOTE — Telephone Encounter (Signed)
Left message for patient to call.

## 2020-08-25 NOTE — Telephone Encounter (Addendum)
Patient informed with all the below note, she will get back with me if she wants to proceed with referral. CBC/ferritin order placed pill check appointment.

## 2020-10-07 ENCOUNTER — Encounter: Payer: Self-pay | Admitting: Obstetrics and Gynecology

## 2020-10-07 ENCOUNTER — Ambulatory Visit (INDEPENDENT_AMBULATORY_CARE_PROVIDER_SITE_OTHER): Payer: BC Managed Care – PPO | Admitting: Obstetrics and Gynecology

## 2020-10-07 ENCOUNTER — Other Ambulatory Visit: Payer: Self-pay

## 2020-10-07 VITALS — BP 110/80 | HR 88 | Ht 67.0 in | Wt 171.0 lb

## 2020-10-07 DIAGNOSIS — N920 Excessive and frequent menstruation with regular cycle: Secondary | ICD-10-CM | POA: Diagnosis not present

## 2020-10-07 DIAGNOSIS — B9689 Other specified bacterial agents as the cause of diseases classified elsewhere: Secondary | ICD-10-CM

## 2020-10-07 DIAGNOSIS — Z862 Personal history of diseases of the blood and blood-forming organs and certain disorders involving the immune mechanism: Secondary | ICD-10-CM

## 2020-10-07 DIAGNOSIS — R102 Pelvic and perineal pain: Secondary | ICD-10-CM | POA: Diagnosis not present

## 2020-10-07 DIAGNOSIS — N949 Unspecified condition associated with female genital organs and menstrual cycle: Secondary | ICD-10-CM | POA: Diagnosis not present

## 2020-10-07 DIAGNOSIS — N760 Acute vaginitis: Secondary | ICD-10-CM

## 2020-10-07 DIAGNOSIS — N898 Other specified noninflammatory disorders of vagina: Secondary | ICD-10-CM

## 2020-10-07 LAB — WET PREP FOR TRICH, YEAST, CLUE

## 2020-10-07 MED ORDER — DOXYCYCLINE HYCLATE 100 MG PO CAPS: 100.0000 mg | ORAL_CAPSULE | Freq: Two times a day (BID) | ORAL | 0 refills | Status: DC

## 2020-10-07 MED ORDER — NORETHINDRONE 0.35 MG PO TABS
1.0000 | ORAL_TABLET | Freq: Every day | ORAL | 2 refills | Status: DC
Start: 2020-10-07 — End: 2021-06-29

## 2020-10-07 MED ORDER — METRONIDAZOLE 500 MG PO TABS: 500.0000 mg | ORAL_TABLET | Freq: Two times a day (BID) | ORAL | 0 refills | Status: DC

## 2020-10-07 NOTE — Progress Notes (Signed)
GYNECOLOGY  VISIT   HPI: 40 y.o.   Married Black or [place] American Not Hispanic or Latino  female   G2P2 with Patient's last menstrual period was 09/28/2020.   here for f/u menorrhagia.  The patient was seen in 6/22 with menorrhagia and anemia. Ultrasound showed a thickened, uniform endometrium, otherwise normal ultrasound. TSH was normal. Hgb was 8.6, ferritin 2. She was started on oral iron and OCP's.   The first month on OCP's, her cycle was better. On her second pack she started cramping and bleeding in the second week. She just went off the pill. No late or missed pills. It was like her normal cycle.  She has continued to take one iron tablet daily. She is taking other supplements and has increased the iron in her diet.  She felt like her breasts were engorged on OCP's, she worsening migraines. She feels better off of OCP's.  F/u in 7/22 Hgb was 9.1, ferritin <3. Negative cervical cultures.   In 7/22 she was treated for BV. In the last 2 days she has noticed a vaginal odor again. Last night she had cramps that woke her up. Feels different than her period cramps. No bowel or bladder c/o.   No STD concerns.     GYNECOLOGIC HISTORY: Patient's last menstrual period was 09/28/2020. Contraception:Tubal ligation  Menopausal hormone therapy: na        OB History     Gravida  2   Para  2   Term      Preterm      AB      Living  2      SAB      IAB      Ectopic      Multiple      Live Births                 Patient Active Problem List   Diagnosis Date Noted   Menstrual migraine 04/01/2019   PMDD (premenstrual dysphoric disorder) 03/14/2018   Menorrhagia 04/19/2012    Past Medical History:  Diagnosis Date   Anemia    Carpal tunnel syndrome    Migraine without aura     Past Surgical History:  Procedure Laterality Date   CESAREAN SECTION     TUBAL LIGATION      Current Outpatient Medications  Medication Sig Dispense Refill   Calcium  Carbonate-Vit D-Min (CALCIUM 1200 PO) Take by mouth.     cholecalciferol (VITAMIN D3) 25 MCG (1000 UNIT) tablet Take 1,000 Units by mouth daily.     Fe Bisgly-Succ-C-Thre-B12-FA (IRON-150 PO) Take 150 mg by mouth daily.     Multiple Vitamin (MULTIVITAMIN) capsule Take 1 capsule by mouth daily.     norethindrone-ethinyl estradiol-FE (LOESTRIN FE) 1-20 MG-MCG tablet Take 1 tablet by mouth daily. 84 tablet 0   No current facility-administered medications for this visit.     ALLERGIES: Patient has no known allergies.  Family History  Problem Relation Age of Onset   Diabetes Father    Heart disease Father    Cancer Paternal Grandfather        Lung    Social History   Socioeconomic History   Marital status: Married    Spouse name: Not on file   Number of children: Not on file   Years of education: Not on file   Highest education level: Not on file  Occupational History   Not on file  Tobacco Use   Smoking status: Never  Smokeless tobacco: Never  Vaping Use   Vaping Use: Never used  Substance and Sexual Activity   Alcohol use: Yes    Alcohol/week: 7.0 standard drinks    Types: 7 Standard drinks or equivalent per week   Drug use: Never   Sexual activity: Yes    Birth control/protection: Surgical    Comment: BTL-1st intercourse 40 yo-More than 5 partners  Other Topics Concern   Not on file  Social History Narrative   Not on file   Social Determinants of Health   Financial Resource Strain: Not on file  Food Insecurity: Not on file  Transportation Needs: Not on file  Physical Activity: Not on file  Stress: Not on file  Social Connections: Not on file  Intimate Partner Violence: Not on file    Review of Systems  Gastrointestinal:  Negative for abdominal pain.  All other systems reviewed and are negative.  PHYSICAL EXAMINATION:    BP 110/80   Pulse 88   Ht 5\' 7"  (1.702 m)   Wt 171 lb (77.6 kg)   LMP 09/28/2020   SpO2 100%   BMI 26.78 kg/m     General  appearance: alert, cooperative and appears stated age Abdomen: soft, mildly tender in the SP region; non distended, no masses,  no organomegaly  Pelvic: External genitalia:  no lesions              Urethra:  normal appearing urethra with no masses, tenderness or lesions              Bartholins and Skenes: normal                 Vagina: normal appearing vagina with normal color and discharge, no lesions              Cervix: no cervical motion tenderness and no lesions              Bimanual Exam:  Uterus:   anteverted, mobile, tender, normal sized              Adnexa: no mass, fullness, tenderness               Chaperone was present for exam.  1. Menorrhagia with regular cycle Didn't like how she felt on OCP's. Discussed micronor and the mirena IUD. Wants to try micronor.  - norethindrone (MICRONOR) 0.35 MG tablet; Take 1 tablet (0.35 mg total) by mouth daily.  Dispense: 84 tablet; Refill: 2  2. History of anemia - Ferritin - CBC w/Diff  3. Uterine tenderness - doxycycline (VIBRAMYCIN) 100 MG capsule; Take 1 capsule (100 mg total) by mouth 2 (two) times daily. Take BID for 10 days.  Take with food as can cause GI distress.  Dispense: 20 capsule; Refill: 0 - SURESWAB CT/NG/T. vaginalis  4. Pelvic cramping - doxycycline (VIBRAMYCIN) 100 MG capsule; Take 1 capsule (100 mg total) by mouth 2 (two) times daily. Take BID for 10 days.  Take with food as can cause GI distress.  Dispense: 20 capsule; Refill: 0 - SURESWAB CT/NG/T. vaginalis  5. Vaginal odor - WET PREP FOR TRICH, YEAST, CLUE: + clue  6. Bacterial vaginitis - metroNIDAZOLE (FLAGYL) 500 MG tablet; Take 1 tablet (500 mg total) by mouth 2 (two) times daily.  Dispense: 14 tablet; Refill: 0

## 2020-10-08 LAB — FERRITIN: Ferritin: 8 ng/mL — ABNORMAL LOW (ref 16–154)

## 2020-10-08 LAB — CBC WITH DIFFERENTIAL/PLATELET
Absolute Monocytes: 429 cells/uL (ref 200–950)
Basophils Absolute: 28 cells/uL (ref 0–200)
Basophils Relative: 0.5 %
Eosinophils Absolute: 88 cells/uL (ref 15–500)
Eosinophils Relative: 1.6 %
HCT: 36 % (ref 35.0–45.0)
Hemoglobin: 10.7 g/dL — ABNORMAL LOW (ref 11.7–15.5)
Lymphs Abs: 1639 cells/uL (ref 850–3900)
MCH: 24.5 pg — ABNORMAL LOW (ref 27.0–33.0)
MCHC: 29.7 g/dL — ABNORMAL LOW (ref 32.0–36.0)
MCV: 82.6 fL (ref 80.0–100.0)
MPV: 11.1 fL (ref 7.5–12.5)
Monocytes Relative: 7.8 %
Neutro Abs: 3317 cells/uL (ref 1500–7800)
Neutrophils Relative %: 60.3 %
Platelets: 299 10*3/uL (ref 140–400)
RBC: 4.36 10*6/uL (ref 3.80–5.10)
RDW: 19.3 % — ABNORMAL HIGH (ref 11.0–15.0)
Total Lymphocyte: 29.8 %
WBC: 5.5 10*3/uL (ref 3.8–10.8)

## 2020-10-08 LAB — SURESWAB CT/NG/T. VAGINALIS
C. trachomatis RNA, TMA: NOT DETECTED
N. gonorrhoeae RNA, TMA: NOT DETECTED
Trichomonas vaginalis RNA: NOT DETECTED

## 2020-10-12 ENCOUNTER — Other Ambulatory Visit: Payer: Self-pay | Admitting: Obstetrics & Gynecology

## 2020-10-12 DIAGNOSIS — D500 Iron deficiency anemia secondary to blood loss (chronic): Secondary | ICD-10-CM

## 2020-11-12 ENCOUNTER — Other Ambulatory Visit: Payer: Self-pay | Admitting: Obstetrics and Gynecology

## 2020-11-12 DIAGNOSIS — N920 Excessive and frequent menstruation with regular cycle: Secondary | ICD-10-CM

## 2020-11-12 DIAGNOSIS — Z862 Personal history of diseases of the blood and blood-forming organs and certain disorders involving the immune mechanism: Secondary | ICD-10-CM

## 2020-11-12 DIAGNOSIS — N943 Premenstrual tension syndrome: Secondary | ICD-10-CM

## 2020-11-12 NOTE — Telephone Encounter (Signed)
Left message to call Noreene Larsson, RN at [facility], [phone].   Last OV 10/07/20, changed to POP Hx of tubal ligation  Rx refill request received for Junel FE 1/20  Call to patient to confirm which contraceptive she is currently taking.

## 2021-01-11 ENCOUNTER — Other Ambulatory Visit: Payer: BC Managed Care – PPO

## 2021-03-11 IMAGING — CT CT ABD-PELV W/ CM
2 of 4 series · 16 of 46 positions shown, 18 images · IV contrast (Omnipaque)
Comparison: CT 07/30/2008

CLINICAL DATA: Abdomen pain with fever

EXAM:
CT ABDOMEN AND PELVIS WITH CONTRAST
TECHNIQUE: Multidetector CT imaging of the abdomen and pelvis was performed
using the standard protocol following bolus administration of
intravenous contrast.
CONTRAST:  100mL OMNIPAQUE IOHEXOL 300 MG/ML  SOLN

[Series 2: axial st · axial · 0.78mm/px · z∈[-493,-108]mm · 13 of 85 slices shown, 15 images]
[im 4/85  soft-tissue]
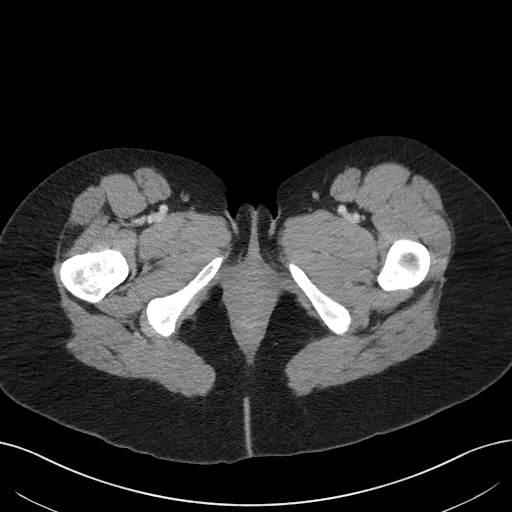
[im 4/85  bone]
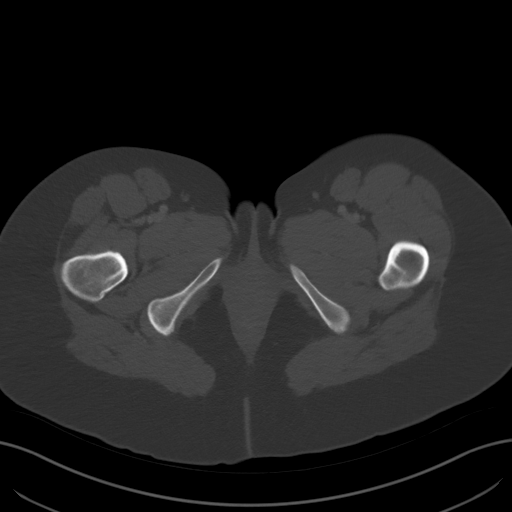
[im 11/85  soft-tissue]
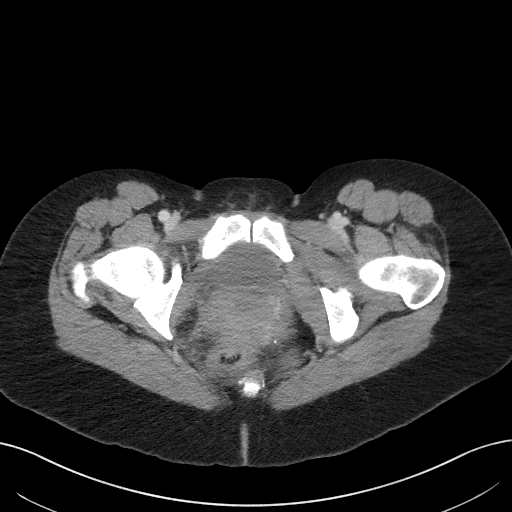
[im 18/85  soft-tissue]
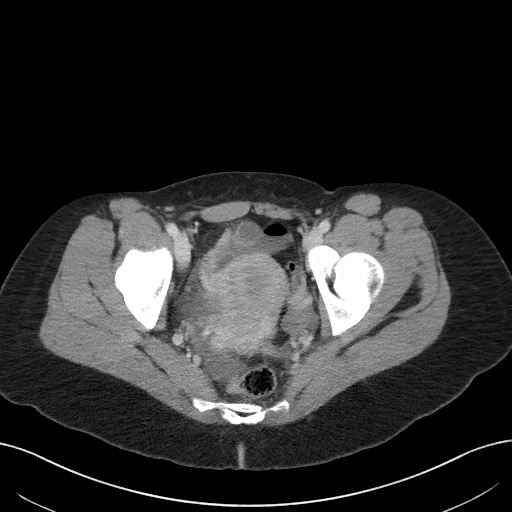
[im 25/85  soft-tissue]
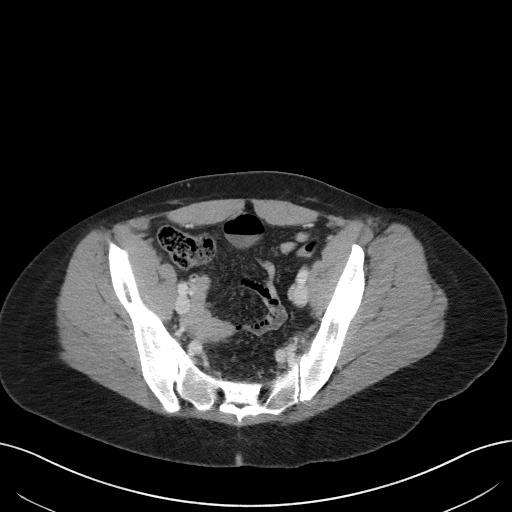
[im 29/85  soft-tissue]
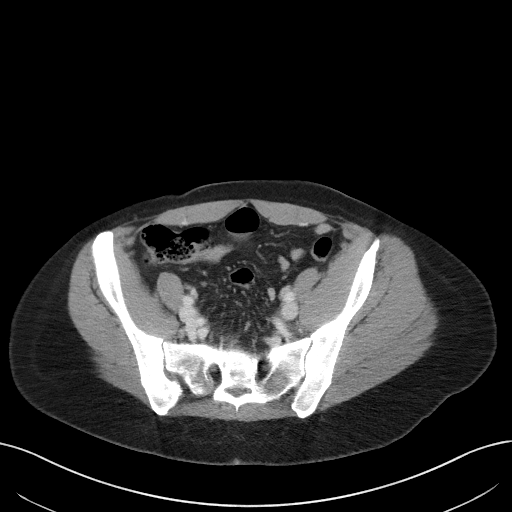
[im 36/85  soft-tissue]
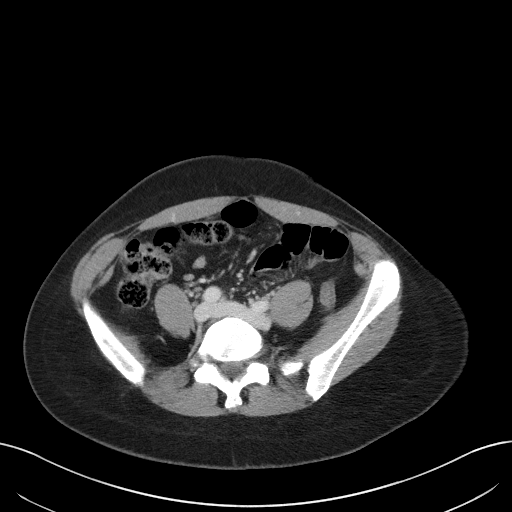
[im 43/85  soft-tissue]
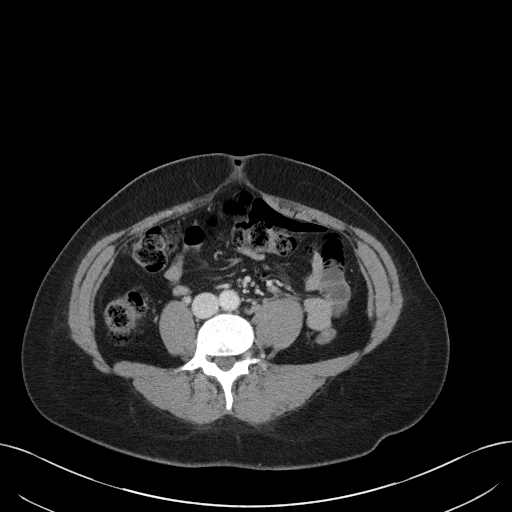
[im 50/85  soft-tissue]
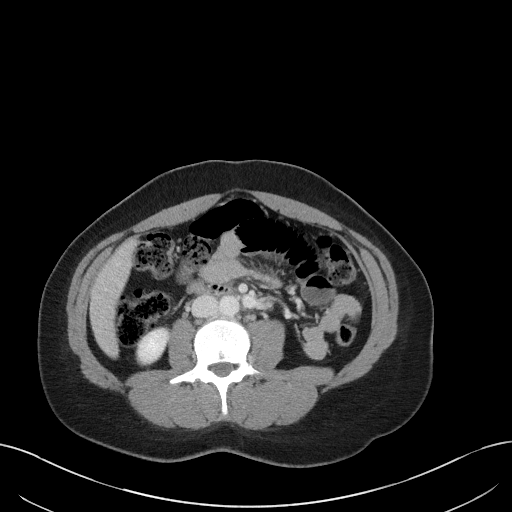
[im 57/85  soft-tissue]
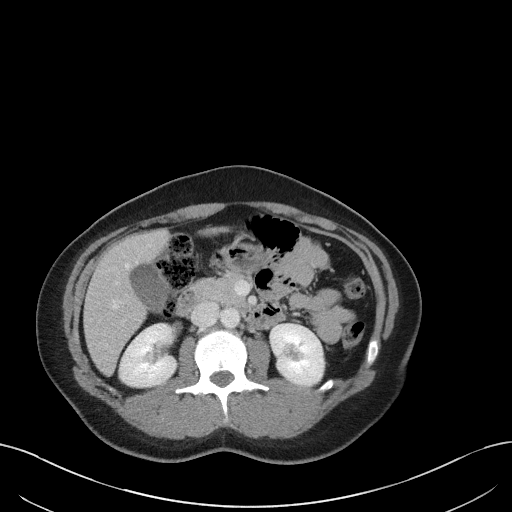
[im 57/85  bone]
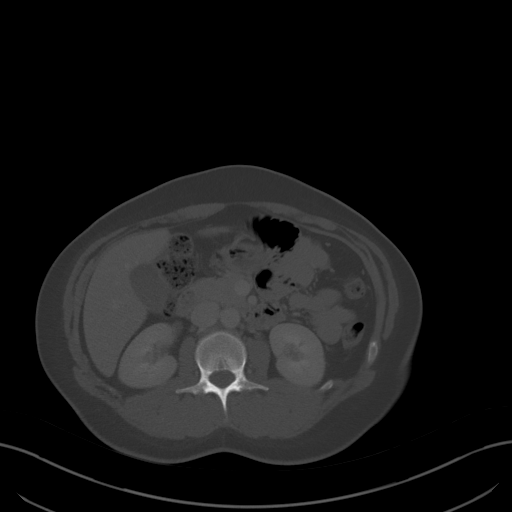
[im 60/85  soft-tissue]
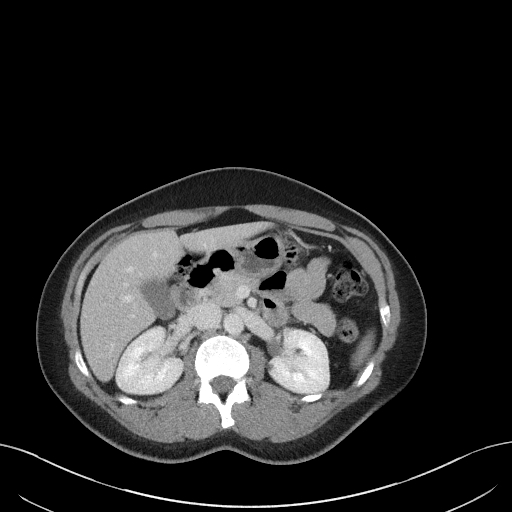
[im 67/85  soft-tissue]
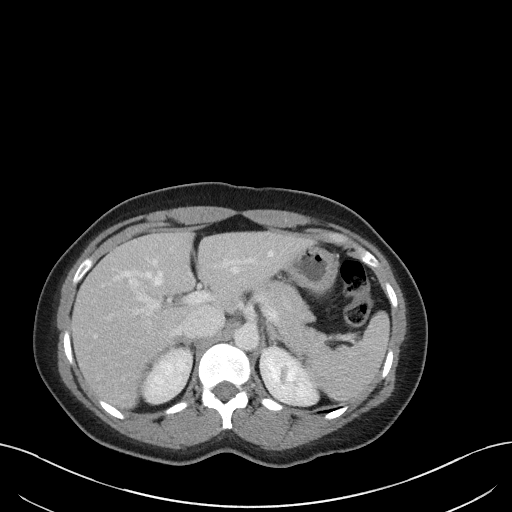
[im 74/85  soft-tissue]
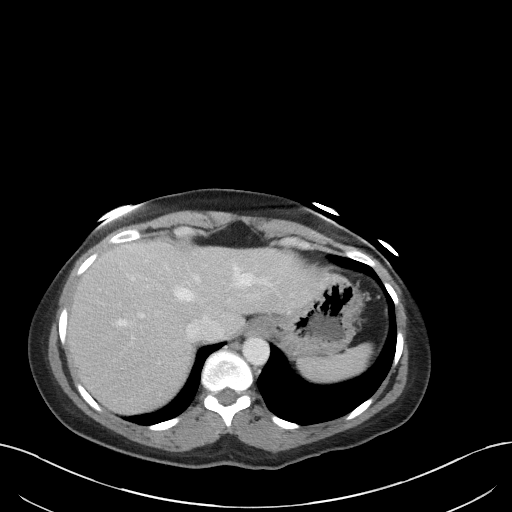
[im 81/85  soft-tissue]
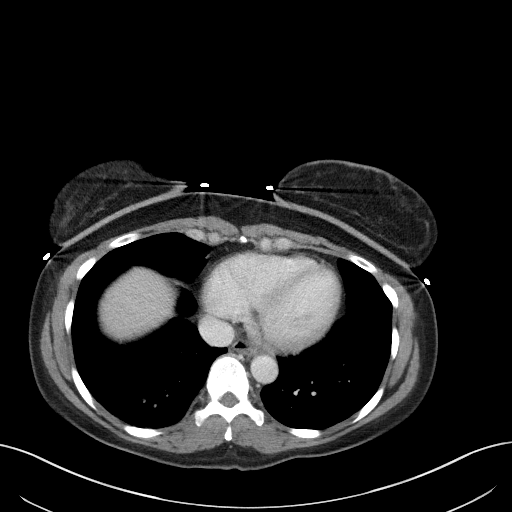

[Series 5: coronal st · coronal · 0.67mm/px · 3 of 80 slices shown]
[im 27/80  soft-tissue]
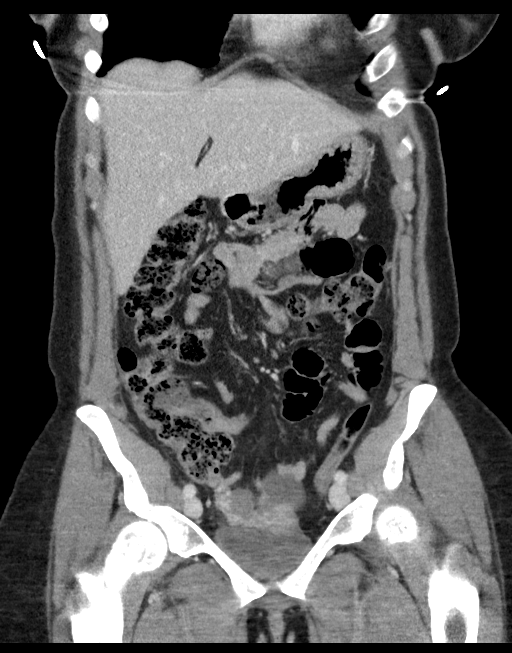
[im 36/80  soft-tissue]
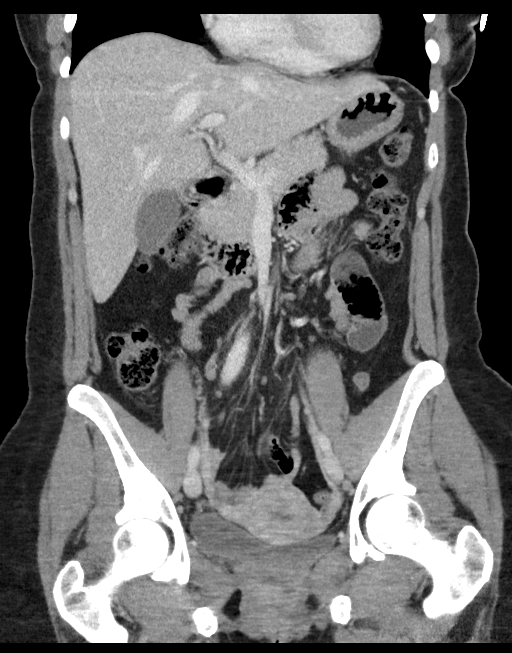
[im 44/80  soft-tissue]
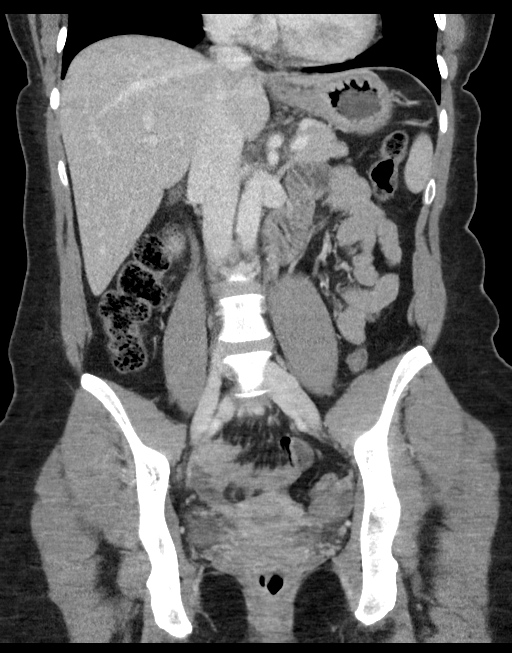

[16 of 46 positions shown; findings below may reference images not displayed]

FINDINGS: Lower chest: No acute abnormality.

Hepatobiliary: No focal liver abnormality is seen. No gallstones,
gallbladder wall thickening, or biliary dilatation.

Pancreas: Unremarkable. No pancreatic ductal dilatation or
surrounding inflammatory changes.

Spleen: Normal in size without focal abnormality.

Adrenals/Urinary Tract: Adrenal glands are unremarkable. Kidneys are
normal, without renal calculi, focal lesion, or hydronephrosis.
Bladder is unremarkable.

Stomach/Bowel: The stomach is nonenlarged. Mild air and fluid-filled
borderline distended small bowel in the upper abdomen but without
convincing evidence for obstruction at this time. No bowel wall
thickening. Negative appendix.

Vascular/Lymphatic: No significant vascular findings are present. No
enlarged abdominal or pelvic lymph nodes.

Reproductive: Uterus and bilateral adnexa are unremarkable.

Other: Small free fluid in the pelvis.  No free air

Musculoskeletal: No acute or significant osseous findings.
IMPRESSION: 1. No CT evidence for acute intra-abdominal or pelvic abnormality.
2. Small free fluid in the pelvis.

## 2021-06-29 ENCOUNTER — Encounter: Payer: Self-pay | Admitting: Nurse Practitioner

## 2021-06-29 ENCOUNTER — Ambulatory Visit (INDEPENDENT_AMBULATORY_CARE_PROVIDER_SITE_OTHER): Payer: BC Managed Care – PPO | Admitting: Nurse Practitioner

## 2021-06-29 VITALS — BP 118/66 | Ht 67.0 in | Wt 182.0 lb

## 2021-06-29 DIAGNOSIS — Z01419 Encounter for gynecological examination (general) (routine) without abnormal findings: Secondary | ICD-10-CM

## 2021-06-29 NOTE — Progress Notes (Signed)
   Teretha Chalupa [facility] [birth year] [id number]   History:  41 y.o. G2P2 presents for annual exam. Cycles very 3 weeks. She was having frequent, heavy menses last year. Negative workup, started on COCs but did not tolerate, did fine on POPs but stopped taking and menses have ben tolerable. Taking iron supplement. Multiple family members with hysterectomies due to fibroids, including mother and one sister. No fibroids on ultrasound 07/2020. BTL. Normal pap history.   Gynecologic History Patient's last menstrual period was 06/10/2021. Period Cycle (Days): 21 Period Duration (Days): 7 Period Pattern: Regular Menstrual Flow: Moderate, Heavy Dysmenorrhea: (!) Moderate Dysmenorrhea Symptoms: Cramping Contraception/Family planning: tubal ligation Sexually active: Yes  Health Maintenance Last Pap: 06/22/2020. Results were: Normal, 5-year repeat Last mammogram: 12/2020. Results were: Normal after ultrasound Last colonoscopy: Not indicated Last Dexa: Not indicated   Past medical history, past surgical history, family history and social history were all reviewed and documented in the EPIC chart. 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   ROS:  A ROS was performed and pertinent positives and negatives are included.  Exam:  Vitals:   06/29/21 1404  BP: 118/66  Weight: 182 lb (82.6 kg)  Height: 5\' 7"  (1.702 m)    Body mass index is 28.51 kg/m.  General appearance:  Normal Thyroid:  Symmetrical, normal in size, without palpable masses or nodularity. Respiratory  Auscultation:  Clear without wheezing or rhonchi Cardiovascular  Auscultation:  Regular rate, without rubs, murmurs or gallops  Edema/varicosities:  Not grossly evident Abdominal  Soft,nontender, without masses, guarding or rebound.  Liver/spleen:  No organomegaly noted  Hernia:  None appreciated  Skin  Inspection:  Grossly normal Breasts: Examined lying and sitting.   Right: Without masses,  retractions, nipple discharge or axillary adenopathy.   Left: Without masses, retractions, nipple discharge or axillary adenopathy. Genitourinary   Inguinal/mons:  Normal without inguinal adenopathy  External genitalia:  Normal appearing vulva with no masses, tenderness, or lesions  BUS/Urethra/Skene's glands:  Normal  Vagina:  Normal appearing with normal color and discharge, no lesions  Cervix:  Normal appearing without discharge or lesions  Uterus:  Normal in size, shape and contour.  Midline and mobile, nontender  Adnexa/parametria:     Rt: Normal in size, without masses or tenderness.   Lt: Normal in size, without masses or tenderness.  Anus and perineum: Normal  Digital rectal exam: Normal sphincter tone without palpated masses or tenderness  Patient informed chaperone available to be present for breast and pelvic exam. Patient has requested no chaperone to be present. Patient has been advised what will be completed during breast and pelvic exam.   Assessment/Plan:  41 y.o. G2P2 for annual exam.   Well female exam with routine gynecological exam - Education provided on SBEs, importance of preventative screenings, current guidelines, high calcium diet, regular exercise, and multivitamin daily.  Labs with PCP.   Screening for cervical cancer - Normal pap history. Will repeat at 5-year interval per guidelines.   Screening for breast cancer - Normal mammogram history.  Continue annual screenings.  Normal breast exam today.  Return in 1 year for annual.      41 DNP, 2:28 PM 06/29/2021

## 2021-07-16 ENCOUNTER — Encounter: Payer: Self-pay | Admitting: Radiology

## 2021-07-16 ENCOUNTER — Ambulatory Visit: Payer: BC Managed Care – PPO | Admitting: Radiology

## 2021-07-16 VITALS — BP 110/68 | Temp 98.2°F

## 2021-07-16 DIAGNOSIS — R350 Frequency of micturition: Secondary | ICD-10-CM

## 2021-07-16 DIAGNOSIS — N3091 Cystitis, unspecified with hematuria: Secondary | ICD-10-CM

## 2021-07-16 MED ORDER — NITROFURANTOIN MONOHYD MACRO 100 MG PO CAPS: 100.0000 mg | ORAL_CAPSULE | Freq: Two times a day (BID) | ORAL | 0 refills | Status: DC

## 2021-07-16 NOTE — Progress Notes (Signed)
      SUBJECTIVE: Claudia Potts is a 41 y.o. female who complains of urinary frequency, urgency and dysuria x 3 days, without flank pain, fever, chills, or abnormal vaginal discharge or bleeding.   OBJECTIVE: Appears well, in no apparent distress.  Vital signs are normal. The abdomen is soft without tenderness, guarding, mass, rebound or organomegaly. No CVA tenderness or inguinal adenopathy noted. Urine dipstick shows positive for WBC's, positive for RBC's, and positive for leukocytes.  Micro exam:  WBC's per HPF,  RBC's per HPF, and moderate+ bacteria.   Chaperone offered and declined for exam.  ASSESSMENT: UTI; uncomplicated without evidence of pyelonephritis  PLAN: Macrobid po BID x 7 days Will send urine culture and sensitivity.  Treatment per orders - also push fluids, avoid bladder irritants. Instructed she may use Pyridium OTC prn. Call or return to clinic prn if these symptoms worsen or fail to improve as anticipated. Pyelo precautions reviewed with patient.

## 2021-07-19 LAB — URINALYSIS, COMPLETE W/RFL CULTURE
Bilirubin Urine: NEGATIVE
Glucose, UA: NEGATIVE
Hyaline Cast: NONE SEEN /LPF
Nitrites, Initial: NEGATIVE
Protein, ur: NEGATIVE
Specific Gravity, Urine: 1.02 (ref 1.001–1.035)
pH: 7 (ref 5.0–8.0)

## 2021-07-19 LAB — CULTURE INDICATED

## 2021-07-19 LAB — URINE CULTURE
MICRO NUMBER:: 13535680
SPECIMEN QUALITY:: ADEQUATE

## 2022-06-07 ENCOUNTER — Ambulatory Visit (INDEPENDENT_AMBULATORY_CARE_PROVIDER_SITE_OTHER): Payer: BC Managed Care – PPO | Admitting: Obstetrics & Gynecology

## 2022-06-07 ENCOUNTER — Encounter: Payer: Self-pay | Admitting: Obstetrics & Gynecology

## 2022-06-07 VITALS — BP 118/78 | HR 77 | Temp 98.6°F

## 2022-06-07 DIAGNOSIS — B3731 Acute candidiasis of vulva and vagina: Secondary | ICD-10-CM

## 2022-06-07 DIAGNOSIS — R8290 Unspecified abnormal findings in urine: Secondary | ICD-10-CM

## 2022-06-07 DIAGNOSIS — N898 Other specified noninflammatory disorders of vagina: Secondary | ICD-10-CM

## 2022-06-07 DIAGNOSIS — R32 Unspecified urinary incontinence: Secondary | ICD-10-CM

## 2022-06-07 LAB — CULTURE INDICATED

## 2022-06-07 LAB — WET PREP FOR TRICH, YEAST, CLUE

## 2022-06-07 MED ORDER — SULFAMETHOXAZOLE-TRIMETHOPRIM 800-160 MG PO TABS: 1.0000 | ORAL_TABLET | Freq: Two times a day (BID) | ORAL | 0 refills | Status: AC

## 2022-06-07 MED ORDER — FLUCONAZOLE 150 MG PO TABS: 150.0000 mg | ORAL_TABLET | ORAL | 0 refills | Status: AC

## 2022-06-07 NOTE — Progress Notes (Signed)
    Claudia Potts 23-Jul-1980 [id number]        42 y.o.  G2P2L2 S/P BTL  RP: Odor in urine with incontinence   HPI: Pt c/o urinary incontinence, fishy odor 4 days ago.  On the same day, she had left side pelvic cramping.  She took Azo, vinegar and other remedies.  No Sx currently.  No fever.  Sexually active with husband the night before having Sxs.      OB History  Gravida Para Term Preterm AB Living  2 2       2   SAB IAB Ectopic Multiple Live Births               # Outcome Date GA Lbr Len/2nd Weight Sex Delivery Anes PTL Lv  2 Para           1 Para             Past medical history,surgical history, problem list, medications, allergies, family history and social history were all reviewed and documented in the EPIC chart.   Directed ROS with pertinent positives and negatives documented in the history of present illness/assessment and plan.  Exam:  Vitals:   06/07/22 1008  BP: 118/78  Pulse: 77  Temp: 98.6 F (37 C)  TempSrc: Oral  SpO2: 99%   General appearance:  Normal  CVAT Neg bilaterally  Gynecologic exam: Vulva normal.  Speculum:  Cervix/Vagina normal.  Mild vaginal discharge.  Wet prep done.  SureSwab Advanced Plus done.  Wet prep:  Yeasts present  U/A: Dark yellow, slightly cloudy, Pro Neg, Nit Neg, WBC 0-5, RBC 0-2, Bacteria Few.  U. Culture pending.   Assessment/Plan:  42 y.o. G2P2   1. Urinary incontinence, unspecified type Pt c/o urinary incontinence, fishy odor 4 days ago.  On the same day, she had left side pelvic cramping.  She took Azo, vinegar and other remedies.  No Sx currently.  No fever.  Sexually active with husband the night before having Sxs.  U/A mildly perturbed.  Pending U. Culture.  Will treat with Bactrim DS 1 tab PO BID x 3 days.  Prescription sent to pharmacy. - Urinalysis,Complete w/RFL Culture  2. Vaginal or urine odor Wet prep confirmed a yeast vaginitis.  No BV.  Will treat with Fluconazole, prescription sent. - WET PREP  FOR TRICH, YEAST, CLUE - SureSwab Advanced Vaginitis Plus,TMA  Other orders - D-MANNOSE PO; Take by mouth. - Phenazopyridine HCl (AZO TABS PO); Take by mouth. - Urine Culture - REFLEXIVE URINE CULTURE - sulfamethoxazole-trimethoprim (BACTRIM DS) 800-160 MG tablet; Take 1 tablet by mouth 2 (two) times daily for 3 days. - fluconazole (DIFLUCAN) 150 MG tablet; Take 1 tablet (150 mg total) by mouth every other day for 3 doses.   Genia Del MD, 10:27 AM 06/07/2022

## 2022-06-08 LAB — URINALYSIS, COMPLETE W/RFL CULTURE
Bilirubin Urine: NEGATIVE
Glucose, UA: NEGATIVE
Hyaline Cast: NONE SEEN /LPF
Ketones, ur: NEGATIVE
Nitrites, Initial: NEGATIVE
Protein, ur: NEGATIVE
Specific Gravity, Urine: 1.025 (ref 1.001–1.035)
pH: 5.5 (ref 5.0–8.0)

## 2022-06-08 LAB — SURESWAB® ADVANCED VAGINITIS PLUS,TMA
C. trachomatis RNA, TMA: NOT DETECTED
CANDIDA SPECIES: NOT DETECTED
Candida glabrata: NOT DETECTED
N. gonorrhoeae RNA, TMA: NOT DETECTED
SURESWAB(R) ADV BACTERIAL VAGINOSIS(BV),TMA: NEGATIVE
TRICHOMONAS VAGINALIS (TV),TMA: NOT DETECTED

## 2022-06-08 LAB — URINE CULTURE
MICRO NUMBER:: 14923350
SPECIMEN QUALITY:: ADEQUATE

## 2022-07-04 ENCOUNTER — Encounter: Payer: Self-pay | Admitting: Nurse Practitioner

## 2022-07-04 ENCOUNTER — Ambulatory Visit (INDEPENDENT_AMBULATORY_CARE_PROVIDER_SITE_OTHER): Payer: BC Managed Care – PPO | Admitting: Nurse Practitioner

## 2022-07-04 VITALS — BP 110/62 | HR 74 | Ht 67.0 in | Wt 185.0 lb

## 2022-07-04 DIAGNOSIS — Z01419 Encounter for gynecological examination (general) (routine) without abnormal findings: Secondary | ICD-10-CM | POA: Diagnosis not present

## 2022-07-04 LAB — CBC WITH DIFFERENTIAL/PLATELET
Basophils Absolute: 58 cells/uL (ref 0–200)
Hemoglobin: 9.9 g/dL — ABNORMAL LOW (ref 11.7–15.5)
MCHC: 29.6 g/dL — ABNORMAL LOW (ref 32.0–36.0)
MCV: 81.7 fL (ref 80.0–100.0)
MPV: 11.7 fL (ref 7.5–12.5)
RDW: 16.1 % — ABNORMAL HIGH (ref 11.0–15.0)
Total Lymphocyte: 31 %

## 2022-07-04 NOTE — Progress Notes (Signed)
   Claudia Potts [facility] [birth year] [id number]   History:  42 y.o. G2P2 presents for annual exam. Monthly cycles with moderate dysmenorrhea. Cycles occur ~21-28 days. Bleeding is shorter and less heavy. Did not tolerate COCs in the past, did fine on POPs but stopped taking and menses have been tolerable. Taking iron supplement. Multiple family members with hysterectomies due to fibroids, including mother and one sister. No fibroids on ultrasound 07/2020. BTL. Normal pap history.   Gynecologic History Patient's last menstrual period was 06/11/2022 (exact date). Period Cycle (Days): 28 Period Duration (Days): 5 Period Pattern: Regular Menstrual Flow: Moderate Menstrual Control: Tampon, Thin pad Menstrual Control Change Freq (Hours): 3 Dysmenorrhea: (!) Moderate Dysmenorrhea Symptoms: Cramping, Headache Contraception/Family planning: tubal ligation Sexually active: Yes  Health Maintenance Last Pap: 06/22/2020. Results were: Normal neg HPV, 5-year repeat Last mammogram: 12/2020. Results were: Normal after ultrasound Last colonoscopy: Not indicated Last Dexa: Not indicated   Past medical history, past surgical history, family history and social history were all reviewed and documented in the EPIC chart. 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   ROS:  A ROS was performed and pertinent positives and negatives are included.  Exam:  Vitals:   07/04/22 0811  BP: 110/62  Pulse: 74  SpO2: 100%  Weight: 185 lb (83.9 kg)  Height: 5\' 7"  (1.702 m)     Body mass index is 28.98 kg/m.  General appearance:  Normal Thyroid:  Symmetrical, normal in size, without palpable masses or nodularity. Respiratory  Auscultation:  Clear without wheezing or rhonchi Cardiovascular  Auscultation:  Regular rate, without rubs, murmurs or gallops  Edema/varicosities:  Not grossly evident Abdominal  Soft,nontender, without masses, guarding or rebound.  Liver/spleen:  No  organomegaly noted  Hernia:  None appreciated  Skin  Inspection:  Grossly normal Breasts: Examined lying and sitting.   Right: Without masses, retractions, nipple discharge or axillary adenopathy.   Left: Without masses, retractions, nipple discharge or axillary adenopathy. Genitourinary   Inguinal/mons:  Normal without inguinal adenopathy  External genitalia:  Normal appearing vulva with no masses, tenderness, or lesions  BUS/Urethra/Skene's glands:  Normal  Vagina:  Normal appearing with normal color and discharge, no lesions  Cervix:  Normal appearing without discharge or lesions  Uterus:  Normal in size, shape and contour.  Midline and mobile, nontender  Adnexa/parametria:     Rt: Normal in size, without masses or tenderness.   Lt: Normal in size, without masses or tenderness.  Anus and perineum: Normal  Digital rectal exam: Deferred  Patient informed chaperone available to be present for breast and pelvic exam. Patient has requested no chaperone to be present. Patient has been advised what will be completed during breast and pelvic exam.   Assessment/Plan:  42 y.o. G2P2 for annual exam.   Well female exam with routine gynecological exam - Plan: CBC with Differential/Platelet, Comprehensive metabolic panel, Lipid panel.  Education provided on SBEs, importance of preventative screenings, current guidelines, high calcium diet, regular exercise, and multivitamin daily.    Screening for cervical cancer - Normal pap history. Will repeat at 5-year interval per guidelines.   Screening for breast cancer - Normal mammogram history.  Overdue and encouraged to schedule.  Normal breast exam today.  Return in 1 year for annual.      Olivia Mackie DNP, 8:21 AM 07/04/2022

## 2022-07-05 ENCOUNTER — Other Ambulatory Visit: Payer: Self-pay | Admitting: Nurse Practitioner

## 2022-07-05 DIAGNOSIS — D500 Iron deficiency anemia secondary to blood loss (chronic): Secondary | ICD-10-CM

## 2022-07-05 LAB — COMPREHENSIVE METABOLIC PANEL
AG Ratio: 1.5 (calc) (ref 1.0–2.5)
ALT: 7 U/L (ref 6–29)
AST: 9 U/L — ABNORMAL LOW (ref 10–30)
Albumin: 4.3 g/dL (ref 3.6–5.1)
Alkaline phosphatase (APISO): 50 U/L (ref 31–125)
BUN: 13 mg/dL (ref 7–25)
CO2: 24 mmol/L (ref 20–32)
Calcium: 8.7 mg/dL (ref 8.6–10.2)
Chloride: 108 mmol/L (ref 98–110)
Creat: 0.81 mg/dL (ref 0.50–0.99)
Globulin: 2.8 g/dL (calc) (ref 1.9–3.7)
Glucose, Bld: 107 mg/dL — ABNORMAL HIGH (ref 65–99)
Potassium: 3.8 mmol/L (ref 3.5–5.3)
Sodium: 139 mmol/L (ref 135–146)
Total Bilirubin: 0.3 mg/dL (ref 0.2–1.2)
Total Protein: 7.1 g/dL (ref 6.1–8.1)

## 2022-07-05 LAB — CBC WITH DIFFERENTIAL/PLATELET
Absolute Monocytes: 522 cells/uL (ref 200–950)
Basophils Relative: 1 %
Eosinophils Absolute: 168 cells/uL (ref 15–500)
Eosinophils Relative: 2.9 %
HCT: 33.4 % — ABNORMAL LOW (ref 35.0–45.0)
Lymphs Abs: 1798 cells/uL (ref 850–3900)
MCH: 24.2 pg — ABNORMAL LOW (ref 27.0–33.0)
Monocytes Relative: 9 %
Neutro Abs: 3254 cells/uL (ref 1500–7800)
Neutrophils Relative %: 56.1 %
Platelets: 285 10*3/uL (ref 140–400)
RBC: 4.09 10*6/uL (ref 3.80–5.10)
WBC: 5.8 10*3/uL (ref 3.8–10.8)

## 2022-07-05 LAB — LIPID PANEL
Cholesterol: 179 mg/dL (ref <200)
HDL: 54 mg/dL (ref >=50)
LDL Cholesterol (Calc): 109 mg/dL (calc) — ABNORMAL HIGH
Non-HDL Cholesterol (Calc): 125 mg/dL (calc) (ref <130)
Total CHOL/HDL Ratio: 3.3 (calc) (ref <5.0)
Triglycerides: 71 mg/dL (ref <150)

## 2022-07-05 MED ORDER — FERROUS SULFATE 325 (65 FE) MG PO TBEC
325.0000 mg | DELAYED_RELEASE_TABLET | Freq: Two times a day (BID) | ORAL | 0 refills | Status: DC
Start: 2022-07-05 — End: 2023-08-28

## 2022-07-14 ENCOUNTER — Other Ambulatory Visit: Payer: Self-pay

## 2022-07-14 DIAGNOSIS — D500 Iron deficiency anemia secondary to blood loss (chronic): Secondary | ICD-10-CM

## 2022-07-14 DIAGNOSIS — N920 Excessive and frequent menstruation with regular cycle: Secondary | ICD-10-CM

## 2022-07-14 MED ORDER — FERROUS GLUCONATE 324 (37.5 FE) MG PO TABS
324.0000 mg | ORAL_TABLET | Freq: Two times a day (BID) | ORAL | 0 refills | Status: DC
Start: 2022-07-14 — End: 2023-08-28

## 2022-07-14 MED ORDER — NORETHINDRONE 0.35 MG PO TABS
1.0000 | ORAL_TABLET | Freq: Every day | ORAL | 1 refills | Status: DC
Start: 2022-07-14 — End: 2023-08-28

## 2022-07-14 NOTE — Progress Notes (Signed)
Ferrous gluconate 324mg  #90 po daily with vitamin C containing food or drink.  Micronor #84 one pill at the same time daily

## 2022-07-14 NOTE — Progress Notes (Signed)
Twice, she she would need #[id number] sorry!

## 2023-08-28 ENCOUNTER — Ambulatory Visit: Admitting: Obstetrics and Gynecology

## 2023-08-28 ENCOUNTER — Ambulatory Visit: Payer: Self-pay | Admitting: Obstetrics and Gynecology

## 2023-08-28 ENCOUNTER — Encounter: Payer: Self-pay | Admitting: Obstetrics and Gynecology

## 2023-08-28 VITALS — BP 120/80 | HR 75 | Temp 98.2°F | Ht 67.0 in | Wt 179.8 lb

## 2023-08-28 DIAGNOSIS — N898 Other specified noninflammatory disorders of vagina: Secondary | ICD-10-CM

## 2023-08-28 DIAGNOSIS — Z113 Encounter for screening for infections with a predominantly sexual mode of transmission: Secondary | ICD-10-CM | POA: Diagnosis not present

## 2023-08-28 DIAGNOSIS — N760 Acute vaginitis: Secondary | ICD-10-CM

## 2023-08-28 DIAGNOSIS — B9689 Other specified bacterial agents as the cause of diseases classified elsewhere: Secondary | ICD-10-CM

## 2023-08-28 LAB — WET PREP FOR TRICH, YEAST, CLUE

## 2023-08-28 MED ORDER — METRONIDAZOLE 0.75 % VA GEL: 1.0000 | Freq: Every day | VAGINAL | 0 refills | Status: AC

## 2023-08-28 NOTE — Progress Notes (Signed)
 43 y.o. G2P2 female here for vaginal discharge. Married.  Patient's last menstrual period was 08/20/2023 (approximate). Period Cycle (Days): 28 Period Duration (Days): 5 Menstrual Flow: Moderate Menstrual Control: Tampon, Thin pad Menstrual Control Change Freq (Hours): 2-3 Dysmenorrhea: (!) Mild Dysmenorrhea Symptoms: Headache, Cramping  She reports Vaginal odor, thick white discharge. Pt also reports small bumps on the back of tongue on both sides. Vaginal odor, thick white discharge. Pt also reports small bumps on the back of tongue on both sides. Pt would also like an STD panel. No fever, AUB, N/V, dysuria.  OB History  Gravida Para Term Preterm AB Living  2 2    2   SAB IAB Ectopic Multiple Live Births          # Outcome Date GA Lbr Len/2nd Weight Sex Type Anes PTL Lv  2 Para           1 Para            Past Medical History:  Diagnosis Date   Anemia    Carpal tunnel syndrome    Migraine without aura    Past Surgical History:  Procedure Laterality Date   CESAREAN SECTION     TUBAL LIGATION     Current Outpatient Medications on File Prior to Visit  Medication Sig Dispense Refill   D-MANNOSE PO Take by mouth.     No current facility-administered medications on file prior to visit.   No Known Allergies    PE Today's Vitals   08/28/23 1337  BP: 120/80  Pulse: 75  Temp: 98.2 F (36.8 C)  TempSrc: Oral  SpO2: 98%  Weight: 179 lb 12.8 oz (81.6 kg)  Height: 5' 7 (1.702 m)   Body mass index is 28.16 kg/m.  Physical Exam Vitals reviewed. Exam conducted with a chaperone present.  Constitutional:      General: She is not in acute distress.    Appearance: Normal appearance.  HENT:     Head: Normocephalic and atraumatic.     Nose: Nose normal.  Eyes:     Extraocular Movements: Extraocular movements intact.     Conjunctiva/sclera: Conjunctivae normal.  Pulmonary:     Effort: Pulmonary effort is normal.  Genitourinary:    General: Normal vulva.     Exam  position: Lithotomy position.     Vagina: Vaginal discharge present.     Cervix: Normal. No cervical motion tenderness, discharge or lesion.     Uterus: Normal. Not enlarged and not tender.      Adnexa: Right adnexa normal and left adnexa normal.  Musculoskeletal:        General: Normal range of motion.     Cervical back: Normal range of motion.  Neurological:     General: No focal deficit present.     Mental Status: She is alert.  Psychiatric:        Mood and Affect: Mood normal.        Behavior: Behavior normal.       Assessment and Plan:        Vaginal discharge -     Urinalysis,Complete w/RFL Culture -     WET PREP FOR TRICH, YEAST, CLUE  Screen for STD (sexually transmitted disease) -     Hepatitis B surface antigen -     Hepatitis C antibody -     RPR -     HIV Antibody (routine testing w rflx) -     SURESWAB CT/NG/T. vaginalis  BV (bacterial vaginosis)  Other orders -     metroNIDAZOLE ; Place 1 Applicatorful vaginally at bedtime for 5 days.  Dispense: 50 g; Refill: 0    Vera LULLA Pa, MD

## 2023-08-29 LAB — HEPATITIS B SURFACE ANTIGEN: Hepatitis B Surface Ag: NONREACTIVE

## 2023-08-29 LAB — SURESWAB CT/NG/T. VAGINALIS
C. trachomatis RNA, TMA: NOT DETECTED
N. gonorrhoeae RNA, TMA: NOT DETECTED
Trichomonas vaginalis RNA: NOT DETECTED

## 2023-08-29 LAB — HIV ANTIBODY (ROUTINE TESTING W REFLEX): HIV 1&2 Ab, 4th Generation: NONREACTIVE

## 2023-08-29 LAB — HEPATITIS C ANTIBODY: Hepatitis C Ab: NONREACTIVE

## 2023-08-29 LAB — RPR: RPR Ser Ql: NONREACTIVE

## 2023-08-30 LAB — URINALYSIS, COMPLETE W/RFL CULTURE
Bilirubin Urine: NEGATIVE
Glucose, UA: NEGATIVE
Hyaline Cast: NONE SEEN /LPF
Leukocyte Esterase: NEGATIVE
Nitrites, Initial: NEGATIVE
Protein, ur: NEGATIVE
Specific Gravity, Urine: 1.025 (ref 1.001–1.035)
pH: 6 (ref 5.0–8.0)

## 2023-08-30 LAB — URINE CULTURE
MICRO NUMBER:: 16753167
SPECIMEN QUALITY:: ADEQUATE

## 2023-08-30 LAB — CULTURE INDICATED

## 2023-11-02 ENCOUNTER — Encounter: Payer: Self-pay | Admitting: Obstetrics and Gynecology

## 2023-11-02 DIAGNOSIS — B9689 Other specified bacterial agents as the cause of diseases classified elsewhere: Secondary | ICD-10-CM

## 2023-11-02 MED ORDER — METRONIDAZOLE 0.75 % VA GEL: 1.0000 | Freq: Every day | VAGINAL | 0 refills | Status: AC

## 2023-11-16 ENCOUNTER — Ambulatory Visit: Admitting: Nurse Practitioner

## 2023-11-26 NOTE — Progress Notes (Unsigned)
 Claudia Potts Claudia Potts Jun 23, 1980 [id number]   History:  43 y.o. G2P2 presents for annual exam. Monthly cycles. Has some dysmenorrhea but has less heavy days. Multiple family members with hysterectomies due to fibroids, including mother and one sister. No fibroids on ultrasound 07/2020. BTL. Normal pap history.   Gynecologic History Patient's last menstrual period was 11/19/2023 (exact date). Period Duration (Days): 7 Period Pattern: Regular Menstrual Flow: Heavy (2nd & 3rd day heavy) Menstrual Control: Maxi pad, Tampon Dysmenorrhea: (!) Moderate Dysmenorrhea Symptoms: Headache, Cramping Contraception/Family planning: tubal ligation Sexually active: Yes  Health Maintenance Last Pap: 06/22/2020. Results were: Normal neg HPV Last mammogram: 12/2020. Results were: Normal after ultrasound Last colonoscopy: Not indicated Last Dexa: Not indicated      11/27/2023    9:00 AM  Depression screen PHQ 2/9  Decreased Interest 0  Down, Depressed, Hopeless 0  PHQ - 2 Score 0     Past medical history, past surgical history, family history and social history were all reviewed and documented in the EPIC chart. Married. 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 43 yo son living in [place], 43 yo daughter.   ROS:  A ROS was performed and pertinent positives and negatives are included.  Exam:  Vitals:   11/27/23 0856  BP: 116/74  Pulse: 84  SpO2: 99%  Weight: 176 lb (79.8 kg)  Height: 5' 6.25 (1.683 m)      Body mass index is 28.19 kg/m.  General appearance:  Normal Thyroid:  Symmetrical, normal in size, without palpable masses or nodularity. Respiratory  Auscultation:  Clear without wheezing or rhonchi Cardiovascular  Auscultation:  Regular rate, without rubs, murmurs or gallops  Edema/varicosities:  Not grossly evident Abdominal  Soft,nontender, without masses, guarding or rebound.  Liver/spleen:  No organomegaly noted  Hernia:  None appreciated   Skin  Inspection:  Grossly normal Breasts: Examined lying and sitting.   Right: Without masses, retractions, nipple discharge or axillary adenopathy.   Left: Without masses, retractions, nipple discharge or axillary adenopathy. Pelvic: External genitalia:  no lesions              Urethra:  normal appearing urethra with no masses, tenderness or lesions              Bartholins and Skenes: normal                 Vagina: normal appearing vagina with normal color and discharge, no lesions              Cervix: no lesions Bimanual Exam:  Uterus:  no masses or tenderness              Adnexa: no mass, fullness, tenderness              Rectovaginal: Deferred              Anus:  normal, no lesions  Claudia Potts, CMA present as chaperone.   Assessment/Plan:  43 y.o. G2P2 for annual exam.   Well female exam with routine gynecological exam - Plan: CBC with Differential/Platelet, Comprehensive metabolic panel. Education provided on SBEs, importance of preventative screenings, current guidelines, high calcium diet, regular exercise, and multivitamin daily.    Elevated LDL cholesterol level - Plan: Lipid panel  Screening for cervical cancer - Normal pap history. Will repeat at 5-year interval per guidelines.   Screening for breast cancer - Normal mammogram history.  Overdue and encouraged to schedule.  Normal breast exam today.  Return  in about 1 year (around 11/26/2024) for Annual.      Claudia Potts Claudia Shutter DNP, 9:18 AM 11/27/2023

## 2023-11-27 ENCOUNTER — Encounter: Payer: Self-pay | Admitting: Nurse Practitioner

## 2023-11-27 ENCOUNTER — Ambulatory Visit (INDEPENDENT_AMBULATORY_CARE_PROVIDER_SITE_OTHER): Admitting: Nurse Practitioner

## 2023-11-27 VITALS — BP 116/74 | HR 84 | Ht 66.25 in | Wt 176.0 lb

## 2023-11-27 DIAGNOSIS — Z1331 Encounter for screening for depression: Secondary | ICD-10-CM | POA: Diagnosis not present

## 2023-11-27 DIAGNOSIS — E7800 Pure hypercholesterolemia, unspecified: Secondary | ICD-10-CM

## 2023-11-27 DIAGNOSIS — Z01419 Encounter for gynecological examination (general) (routine) without abnormal findings: Secondary | ICD-10-CM | POA: Diagnosis not present

## 2023-11-28 ENCOUNTER — Other Ambulatory Visit: Payer: Self-pay | Admitting: Nurse Practitioner

## 2023-11-28 ENCOUNTER — Ambulatory Visit: Payer: Self-pay | Admitting: Nurse Practitioner

## 2023-11-28 DIAGNOSIS — D500 Iron deficiency anemia secondary to blood loss (chronic): Secondary | ICD-10-CM

## 2023-11-28 LAB — COMPREHENSIVE METABOLIC PANEL WITH GFR
AG Ratio: 1.8 (calc) (ref 1.0–2.5)
ALT: 8 U/L (ref 6–29)
AST: 14 U/L (ref 10–30)
Albumin: 4.4 g/dL (ref 3.6–5.1)
Alkaline phosphatase (APISO): 47 U/L (ref 31–125)
BUN: 12 mg/dL (ref 7–25)
CO2: 25 mmol/L (ref 20–32)
Calcium: 9 mg/dL (ref 8.6–10.2)
Chloride: 105 mmol/L (ref 98–110)
Creat: 0.86 mg/dL (ref 0.50–0.99)
Globulin: 2.5 g/dL (ref 1.9–3.7)
Glucose, Bld: 79 mg/dL (ref 65–99)
Potassium: 4.1 mmol/L (ref 3.5–5.3)
Sodium: 136 mmol/L (ref 135–146)
Total Bilirubin: 0.4 mg/dL (ref 0.2–1.2)
Total Protein: 6.9 g/dL (ref 6.1–8.1)
eGFR: 86 mL/min/1.73m2 (ref >=60)

## 2023-11-28 LAB — LIPID PANEL
Cholesterol: 174 mg/dL (ref <200)
HDL: 59 mg/dL (ref >=50)
LDL Cholesterol (Calc): 99 mg/dL
Non-HDL Cholesterol (Calc): 115 mg/dL (ref <130)
Total CHOL/HDL Ratio: 2.9 (calc) (ref <5.0)
Triglycerides: 74 mg/dL (ref <150)

## 2023-11-28 LAB — CBC WITH DIFFERENTIAL/PLATELET
Absolute Lymphocytes: 1545 {cells}/uL (ref 850–3900)
Absolute Monocytes: 415 {cells}/uL (ref 200–950)
Basophils Absolute: 40 {cells}/uL (ref 0–200)
Basophils Relative: 0.8 %
Eosinophils Absolute: 120 {cells}/uL (ref 15–500)
Eosinophils Relative: 2.4 %
HCT: 31.8 % — ABNORMAL LOW (ref 35.0–45.0)
Hemoglobin: 9.2 g/dL — ABNORMAL LOW (ref 11.7–15.5)
MCH: 23.1 pg — ABNORMAL LOW (ref 27.0–33.0)
MCHC: 28.9 g/dL — ABNORMAL LOW (ref 32.0–36.0)
MCV: 79.9 fL — ABNORMAL LOW (ref 80.0–100.0)
MPV: 11.7 fL (ref 7.5–12.5)
Monocytes Relative: 8.3 %
Neutro Abs: 2880 {cells}/uL (ref 1500–7800)
Neutrophils Relative %: 57.6 %
Platelets: 324 Thousand/uL (ref 140–400)
RBC: 3.98 Million/uL (ref 3.80–5.10)
RDW: 16.5 % — ABNORMAL HIGH (ref 11.0–15.0)
Total Lymphocyte: 30.9 %
WBC: 5 Thousand/uL (ref 3.8–10.8)

## 2023-11-28 MED ORDER — FERROUS SULFATE 325 (65 FE) MG PO TBEC: 325.0000 mg | DELAYED_RELEASE_TABLET | Freq: Two times a day (BID) | ORAL | 0 refills | Status: AC

## 2024-01-04 ENCOUNTER — Ambulatory Visit: Admitting: Nurse Practitioner
